# Patient Record
Sex: Female | Born: 1945 | Race: White | Hispanic: No | Marital: Married
Health system: Southern US, Community
[De-identification: ages and names within clinical notes are randomized; demographics above are authoritative.]

## PROBLEM LIST (undated history)

## (undated) DIAGNOSIS — Z972 Presence of dental prosthetic device (complete) (partial): Secondary | ICD-10-CM

## (undated) DIAGNOSIS — Z973 Presence of spectacles and contact lenses: Secondary | ICD-10-CM

## (undated) DIAGNOSIS — K219 Gastro-esophageal reflux disease without esophagitis: Secondary | ICD-10-CM

## (undated) DIAGNOSIS — I10 Essential (primary) hypertension: Secondary | ICD-10-CM

## (undated) DIAGNOSIS — M549 Dorsalgia, unspecified: Secondary | ICD-10-CM

## (undated) DIAGNOSIS — K5090 Crohn's disease, unspecified, without complications: Secondary | ICD-10-CM

## (undated) HISTORY — PX: BACK SURGERY: SHX140

## (undated) HISTORY — PX: UPPER GI ENDOSCOPY: SHX6162

## (undated) HISTORY — PX: TONSILLECTOMY: SUR1361

---

## 2000-05-11 ENCOUNTER — Ambulatory Visit (HOSPITAL_COMMUNITY): Admission: RE | Admit: 2000-05-11 | Discharge: 2000-05-11 | Payer: Self-pay | Admitting: Neurological Surgery

## 2000-05-11 ENCOUNTER — Encounter: Payer: Self-pay | Admitting: Neurological Surgery

## 2000-05-30 ENCOUNTER — Encounter: Payer: Self-pay | Admitting: Neurological Surgery

## 2000-05-30 ENCOUNTER — Ambulatory Visit (HOSPITAL_COMMUNITY): Admission: RE | Admit: 2000-05-30 | Discharge: 2000-05-30 | Payer: Self-pay | Admitting: Neurological Surgery

## 2000-06-22 ENCOUNTER — Encounter: Payer: Self-pay | Admitting: Neurological Surgery

## 2000-06-24 ENCOUNTER — Encounter: Payer: Self-pay | Admitting: Neurological Surgery

## 2000-06-24 ENCOUNTER — Ambulatory Visit (HOSPITAL_COMMUNITY): Admission: RE | Admit: 2000-06-24 | Discharge: 2000-06-25 | Payer: Self-pay | Admitting: Neurological Surgery

## 2004-11-30 HISTORY — PX: TRIGGER FINGER RELEASE: SHX641

## 2005-05-29 ENCOUNTER — Ambulatory Visit (HOSPITAL_COMMUNITY): Admission: RE | Admit: 2005-05-29 | Discharge: 2005-05-29 | Payer: Self-pay | Admitting: Orthopedic Surgery

## 2005-05-29 ENCOUNTER — Ambulatory Visit (HOSPITAL_BASED_OUTPATIENT_CLINIC_OR_DEPARTMENT_OTHER): Admission: RE | Admit: 2005-05-29 | Discharge: 2005-05-29 | Payer: Self-pay | Admitting: Orthopedic Surgery

## 2008-11-30 HISTORY — PX: KNEE ARTHROSCOPY: SHX127

## 2008-12-14 ENCOUNTER — Ambulatory Visit (HOSPITAL_BASED_OUTPATIENT_CLINIC_OR_DEPARTMENT_OTHER): Admission: RE | Admit: 2008-12-14 | Discharge: 2008-12-14 | Payer: Self-pay | Admitting: Orthopedic Surgery

## 2008-12-20 ENCOUNTER — Ambulatory Visit: Payer: Self-pay | Admitting: Vascular Surgery

## 2008-12-20 ENCOUNTER — Encounter (INDEPENDENT_AMBULATORY_CARE_PROVIDER_SITE_OTHER): Payer: Self-pay | Admitting: Orthopedic Surgery

## 2008-12-20 ENCOUNTER — Ambulatory Visit (HOSPITAL_COMMUNITY): Admission: RE | Admit: 2008-12-20 | Discharge: 2008-12-20 | Payer: Self-pay | Admitting: Orthopedic Surgery

## 2010-11-30 HISTORY — PX: LUMBAR LAMINECTOMY: SHX95

## 2010-12-21 ENCOUNTER — Encounter: Payer: Self-pay | Admitting: Orthopedic Surgery

## 2011-03-16 LAB — POCT HEMOGLOBIN-HEMACUE: Hemoglobin: 12.7 g/dL (ref 12.0–15.0)

## 2011-04-10 ENCOUNTER — Other Ambulatory Visit: Payer: Self-pay | Admitting: Neurological Surgery

## 2011-04-10 DIAGNOSIS — M545 Low back pain: Secondary | ICD-10-CM

## 2011-04-14 NOTE — Op Note (Signed)
Leah Mitchell, Leah Mitchell              ACCOUNT NO.:  [id number]   MEDICAL RECORD NO.:  [id number]          PATIENT TYPE:  AMB   LOCATION:  DSC                          FACILITY:  [facility]   PHYSICIAN:  Harvie Junior, M.D.   DATE OF BIRTH:  04/20/1946   DATE OF PROCEDURE:  12/14/2008  DATE OF DISCHARGE:                               OPERATIVE REPORT   PREOPERATIVE DIAGNOSIS:  Medial meniscal tear.   POSTOPERATIVE DIAGNOSES:  Medial meniscal tear.   OPERATIVE PROCEDURES:  Partial posterior horn medial meniscectomy.   SURGEON:  Harvie Junior, MD   ASSISTANT:  Marshia Ly, PA   ANESTHESIA:  General.   BRIEF HISTORY:  [name] is a 65 year old female with a long history  of having had almost a locking sensation in her right knee.  She had  been doing recently well up until that time but ultimately just  developed aggressive locking sensation with severe medial joint line  tenderness.  We evaluated her in the office and felt that she had a  medial meniscal tear, we wanted to make sure.  MRI showed that she did  have a medial meniscal tear at the medial meniscal root.  She is brought  to the operating room for evaluation and debridement as needed.   PROCEDURE:  The patient was brought to the operating room.  After  adequate anesthesia obtained with general anesthetic, the patient was  placed supine on the operating table.  The right leg was prepped and  draped in usual sterile fashion.  Following this, routine arthroscopic  examination of knee revealed there was an obvious posterior horn medial  meniscal tear at the meniscal root.  We debrided this back to smooth and  stable rim, care being taken to try to leave as much meniscus as we  could.  We ultimately did have to take a fair chunk to kind of hollow  out that deep radial tear back at that posterior horn.  The lateral most  flap was debrided as well.  The midbody of meniscus was essentially full  meniscal rim there and sort of  ellipsed back in the posterior area.  Medial femoral condyle had some grade 2 change.  ACL was normal, lateral  side normal.  Patellofemoral joint, no significant arthritic change.  The knee was then copiously and thoroughly irrigated, and then suctioned  dry.  Arthroscopic portals were closed with a bandage.  Sterile  compression dressing was applied.  The patient was taken to the recovery  and found to be in satisfactory condition.  Estimated blood loss for  this procedure was none.      Harvie Junior, M.D.  Electronically Signed     JLG/MEDQ  D:  12/14/2008  T:  12/15/2008  Job:  [id number]

## 2011-04-15 ENCOUNTER — Ambulatory Visit
Admission: RE | Admit: 2011-04-15 | Discharge: 2011-04-15 | Disposition: A | Discharge status: Home / Self Care | Payer: Medicare Other | Source: Ambulatory Visit | Attending: Neurological Surgery | Admitting: Neurological Surgery

## 2011-04-15 DIAGNOSIS — M545 Low back pain: Secondary | ICD-10-CM

## 2011-04-17 NOTE — H&P (Signed)
[place]. [facility]  Patient:    Leah Mitchell, Leah Mitchell                     MRN: [id number] Adm. Date:  [id number] Disc. Date: [id number] Attending:  Jonne Ply                         History and Physical  ADMITTING DIAGNOSIS:  Herniated nucleus pulposus, L5-S1, right, with right S1 radiculopathy.  HISTORY OF PRESENT ILLNESS:  The patient is a 65 year old individual who was treated in the past for a herniated nucleus pulposus at L4-5.  She did well after surgical intervention in August, 1991.  She started to develop pain in the back of the right leg, radiating from the buttock down the posterior aspect of the thigh. She was seen by her chiropractor, Dr. Judy Pimple, in [place], and she underwent some gentle manipulation.  The pain in the right leg then became worse.  She was seen by Dr. Jodi Geralds, orthopedist, and then further work-up was undertaken.  She had some steroid injections into the tensor fasciae latae on the right side.  The symptoms recurred and persisted.  MRI was ultimately performed, demonstrating an herniated nucleus pulposus at the L5-S1 aspect on the right side, compressing the S1 nerve root.  After careful consideration for surgery, she is now being admitted to undergo surgical decompression via a microendoscopic discectomy.  PAST MEDICAL HISTORY:  Significant for Crohns disease; she has otherwise been quite healthy.  She has been on some nonsteroidal anti-inflammatories, but because of her concern of Crohns, she has stopped these medications.  CURRENT MEDICATIONS:  Only E-Vista 60 mg q.d.  ALLERGIES:  She notes an allergy to SULFA, which causes a rash.  PREDNISONE also causes a rash after taking it for a period of about a year.  PAST SURGICAL HISTORY:  Tonsillectomy as a child.  Back operation, 1991.  SOCIAL HISTORY:  She does smoke.  She does not drink alcohol.  Height and weight have been stable at 145 pounds and 5  ft 7 in.  SYSTEMS REVIEW:  Notable for leg pain while walking, wearing ______.  Only findings noted on a 14-point review.  PHYSICAL EXAMINATION:  GENERAL:  She is an alert, oriented, cooperative individual in no overt distress.  VITAL SIGNS:  Blood pressure 144/88, heart rate 66 and regular, respirations 14 and unlabored.  HEENT:  Cranial nerve examination reveals the pupils are 4 mm and briskly reactive to light and accommodation; extraocular movements are full, with asymmetric grimace.  Tongue and uvula are in the midline.  Sclerae and conjunctivae are clear.  NECK:  Reveals no masses and no bruits are heard.  NEUROLOGIC:  Lower extremity strength reveals iliopsoas quadriceps to be ______ and gastrocs have good strength, tone and bulk to confrontation.  Deep tendon reflexes are 2+ in the patellae and 1+ in the Achilles.  Babinski reflexes are downgoing.  Sensation is intact to pin and vibration in distal lower extremities. Straight leg raising is positive on the right side at 45 degrees, and negative n the left to 80 degrees.  Patricks maneuver is negative bilaterally.  Mobility in the back reveals that she can flex forward some 80 degrees and she extends 10 degrees.  Side-bending is possible to 10 degrees without difficulty.  There is modest tenderness to palpation over the rectal ______ superior iliac crest region.  LUNGS:  Clear to auscultation.  HEART:  Regular rate and rhythm.  ABDOMEN:  Soft, bowel sounds are positive.  No masses are palpable.  EXTREMITIES:  Reveal no clubbing, cyanosis or edema.  IMPRESSION:  The patient has a herniated nucleus pulposus at L5-S1 on the right  side.  PLAN:  She is now being admitted to undergo a microdiscectomy. DD:  06/24/00 TD:  06/24/00 Job: [id number]

## 2011-04-17 NOTE — Op Note (Signed)
[place]. [facility]  Patient:    Leah Mitchell, Leah Mitchell                     MRN: [id number] Proc. Date: 06/24/00 Adm. Date:  [id number] Disc. Date: [id number] Attending:  Jonne Ply                           Operative Report  PREOPERATIVE DIAGNOSIS:  L5-S1 herniated nucleus pulposus on the right with right S1 radiculopathy.  POSTOPERATIVE DIAGNOSIS:  L5-S1 herniated nucleus pulposus on the right with right S1 radiculopathy.  PROCEDURE:  Lumbar laminotomy and microdiskectomy with [employer] microendoscopic system and surgical microscope using a microdissection technique.  SURGEON:  Dr. Danielle Dess.  ANESTHESIA:  General endotracheal anesthesia.  INDICATIONS:  The patient is a 65 year old individual who has had significant back and right lower extremity pain.  She has a herniated nucleus pulposus at the L5-S1 level. Having failed conservative management, she is taken to the operating room.  DESCRIPTION OF PROCEDURE:  The patient was brought to the operating room supine on the stretcher.  After smooth induction of general endotracheal anesthesia she was turned prone.  The back was shaved, prepped with Duraprep and draped in a sterile fashion.  The right side of the midline was marked with a marking pen and the image intensifier was brought into view so that the L5-S1 level could be identified.  A K-wire was then passed through the edge of the lamina of L5.  Then using the progressive dilators after making an incision in the skin at the level of the dilator, the dilators were then inserted size over size to enlarge an opening to the 18 mm size using the 5 cm tube to bring it down to the depth of the interlaminar space at L5-S1.  This was then anchored to the table, and the procedure was continued through the [employer]Rx tube clearing the inferior margin lamina of L5 off.  Dissection was then continued out laterally to expose the free edge of the lamina, and the facet  joint of L5-S1.  This was dissected laterally to expose the edge of the common dural tube.  Some scar tissue was encountered at the superior aspect where she had a hemilaminectomy for her disc herniation at L4-5 previously. In this area a small dural tear was noted in the scar tissue.  Attempts to close this primarily were unsuccessful, as the dural tear was on the lateral aspect of the tube, making it difficult to get suture in place.  Nonetheless, after carefully padding and protecting this area, dissection was then carried out laterally and inferiorly to expose the common dural tube, and finally a localizing x-ray identified the position right over the disc space at L5-S1. The common dural tube and the S1 nerve root were retracted medially. Underneath this, a significant amount of disc was in the subligamentous space. The disc space was incised in a cephalad caudad fashion, and several large fragments of disc were removed from the free space.  The disc space was probed, but could not be entered as the inferior margin of L5 abutted the superior margin of the sacrum.  After cleaning out this area carefully it was noted that the S1 nerve root was well dissected free.  The common dural tube was well relieved of any tension on it.  Hemostasis from the epidural veins was obtained with some small pledgets of Gelfoam, which where they abutted  the bone were allowed to remain in place.  Then to close the spinal fluid leak, a mixture of Tisseal was prepared, and this was placed over the common dural tube at the area of the CSF leak.  CSF was noted to be contained by this maneuver.  The area was then inspected.  After several Valsalvas and no leakage being noted the lumbodorsal fascia was closed with 2-0 Vicryl interrupted fashion, 3-0 Vicryl subcuticularly.  The patient tolerated the procedure well, and was returned to the recovery room in stable condition. DD:  06/24/00 TD:  06/25/00 Job:  [id number]

## 2011-04-17 NOTE — Op Note (Signed)
NAMEDIALA, WAXMAN NO.:  [id number]   MEDICAL RECORD NO.:  [id number]          PATIENT TYPE:  OUT   LOCATION:  DFTL                         FACILITY:  [facility]   PHYSICIAN:  Harvie Junior, M.D.   DATE OF BIRTH:  06/29/1946   DATE OF PROCEDURE:  05/29/2005  DATE OF DISCHARGE:  05/29/2005                                 OPERATIVE REPORT   PREOPERATIVE DIAGNOSIS:  Trigger finger, long, right.   POSTOPERATIVE DIAGNOSIS:  Trigger finger, long, right.   OPERATION PERFORMED:  Right trigger finger release.   SURGEON:  Harvie Junior, M.D.   ASSISTANT:  Marshia Ly, P.A.   ANESTHESIA:  General, forearm based IV regional.   INDICATIONS FOR PROCEDURE:  The patient is a 65 year old female with a long  history of having triggering of her right long finger.  We injected her with  excellent relief but because of recurrence and continuing complaints of  pain, the patient was ultimately taken to the operating room for trigger  finger release.   DESCRIPTION OF PROCEDURE:  The patient was brought to the operating room and  after adequate anesthesia was obtained with general anesthetic, patient was  placed on the operating table.  The right hand was prepped and draped in the  usual sterile fashion.  Following this, a small linear incision was made  over the long finger.  Subcutaneous tissue was dissected down to the level  of the A-1 pulley which was clearly identified and divided, care being taken  to identify and protect the digital nerves on both sides of the trigger  finger.  Once this had been released, the tendons were retracted and pulled  into the wound and easy full extension of the flexor tendons was identified  within the wound.  At this point the wound was copiously irrigated and  suctioned dry.  The small skin incision was closed with some interrupted  nylon suture.  Sterile compressive dressing applied.  The patient was then  transferred to the recovery room  where she was noted to be in satisfactory  condition.  The estimated blood loss for this procedure was none.       JLG/MEDQ  D:  06/16/2005  T:  06/17/2005  Job:  [id number]

## 2011-05-04 ENCOUNTER — Encounter (HOSPITAL_COMMUNITY)
Admission: RE | Admit: 2011-05-04 | Discharge: 2011-05-04 | Disposition: A | Discharge status: Home / Self Care | Payer: Medicare Other | Source: Ambulatory Visit | Attending: Neurological Surgery | Admitting: Neurological Surgery

## 2011-05-04 LAB — CBC
HCT: 38.9 % (ref 36.0–46.0)
MCH: 32.8 pg (ref 26.0–34.0)
MCHC: 34.4 g/dL (ref 30.0–36.0)
MCV: 95.3 fL (ref 78.0–100.0)
RDW: 13 % (ref 11.5–15.5)

## 2011-05-05 ENCOUNTER — Inpatient Hospital Stay (HOSPITAL_COMMUNITY): Payer: Medicare Other

## 2011-05-05 ENCOUNTER — Observation Stay (HOSPITAL_COMMUNITY)
Admission: RE | Admit: 2011-05-05 | Discharge: 2011-05-05 | Disposition: A | Discharge status: Home / Self Care | Payer: Medicare Other | Source: Ambulatory Visit | Attending: Neurological Surgery | Admitting: Neurological Surgery

## 2011-05-05 DIAGNOSIS — Z87891 Personal history of nicotine dependence: Secondary | ICD-10-CM | POA: Insufficient documentation

## 2011-05-05 DIAGNOSIS — K5090 Crohn's disease, unspecified, without complications: Secondary | ICD-10-CM | POA: Insufficient documentation

## 2011-05-05 DIAGNOSIS — M5126 Other intervertebral disc displacement, lumbar region: Secondary | ICD-10-CM | POA: Insufficient documentation

## 2011-05-05 DIAGNOSIS — M47817 Spondylosis without myelopathy or radiculopathy, lumbosacral region: Principal | ICD-10-CM | POA: Insufficient documentation

## 2011-05-05 DIAGNOSIS — Z01812 Encounter for preprocedural laboratory examination: Secondary | ICD-10-CM | POA: Insufficient documentation

## 2011-06-04 NOTE — Op Note (Signed)
  [name], [name] NO.:  [id number]  MEDICAL RECORD NO.:  [id number]  LOCATION:  3528                         FACILITY:  [facility]  PHYSICIAN:  Stefani Dama, M.D.  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  PROCEDURE:  Microdiskectomy L5-S1 left with operating microscope, microdissection technique.  SURGEON:  Stefani Dama, MD  FIRST ASSISTANT:  Cristi Loron, MD  ANESTHESIA:  General endotracheal.  INDICATIONS:  Leah Mitchell is a 65 year old individual who has had a previous a right-sided microdiskectomy at L5-S1 on two occasions.  She developed severe left lower extremity pain here in the past month's time.  It has only getting worse despite efforts at bedrest and conservative management and MRI demonstrates presence of a herniated nucleus pulposus L5-S1 on the left side with significant nerve root compromise.  She has been advised regarding need for surgery.  PROCEDURE:  The patient was brought to the operating room, supine on the stretcher.  After smooth induction of general endotracheal anesthesia, she was turned prone.  Back was prepped with alcohol and DuraPrep, draped in sterile fashion.  Midline incision was created through the previous scar and this was dissected down to the lumbodorsal fascia. Fascia was opened on the left side of midline and the paraspinous musculature was stripped from the lower interspace.  L5-S1 was identified positively on radiograph.  Laminotomy of L5 was then undertaken to expose the common dural tube and takeoff of the S1 nerve root.  The disk space was noted to be fairly high riding and  a hemilaminectomy was required in order to see the takeoff of the L5 nerve root, the L5 nerve root and foramen, the disk that was compressing the S1 nerve root in the exit foramen.  With the diskectomy being performed additionally, the common dural tube and the S1 nerve root was well decompressed.  The disk space itself was not entered.  Several fragments of disk were removed and this allowed for good decompression of the S1 nerve root.  Even the crotch of the L5 nerve root superiorly was being impeded by some of this disk material.  Once this was completed and the dissection was completed, no further disk fragments could be identified. The area was carefully inspected.  Hemostasis was achieved with a bipolar cautery and some small pledgets of Gelfoam soaked in thrombin which were later irrigated away and then the lumbodorsal fascia was closed with #1 Vicryl in the lumbodorsal fascia, 2-0 Vicryl in the subcutaneous tissues and 3-0 Vicryl subcuticularly.  The patient tolerated the procedure well, was returned to recovery room in stable condition.     Stefani Dama, M.D.     [name]  D:  05/05/2011  T:  05/06/2011  Job:  [id number]  Electronically Signed by Barnett Abu M.D. on 06/04/2011 05:17:21 PM

## 2012-02-09 DIAGNOSIS — R490 Dysphonia: Secondary | ICD-10-CM | POA: Diagnosis not present

## 2012-02-09 DIAGNOSIS — J060 Acute laryngopharyngitis: Secondary | ICD-10-CM | POA: Diagnosis not present

## 2012-03-01 DIAGNOSIS — R490 Dysphonia: Secondary | ICD-10-CM | POA: Diagnosis not present

## 2012-03-01 DIAGNOSIS — J040 Acute laryngitis: Secondary | ICD-10-CM | POA: Diagnosis not present

## 2012-03-01 DIAGNOSIS — R05 Cough: Secondary | ICD-10-CM | POA: Diagnosis not present

## 2012-03-01 DIAGNOSIS — J209 Acute bronchitis, unspecified: Secondary | ICD-10-CM | POA: Diagnosis not present

## 2012-03-16 DIAGNOSIS — H40039 Anatomical narrow angle, unspecified eye: Secondary | ICD-10-CM | POA: Diagnosis not present

## 2012-03-22 DIAGNOSIS — N899 Noninflammatory disorder of vagina, unspecified: Secondary | ICD-10-CM | POA: Diagnosis not present

## 2012-07-25 DIAGNOSIS — J069 Acute upper respiratory infection, unspecified: Secondary | ICD-10-CM | POA: Diagnosis not present

## 2012-07-25 DIAGNOSIS — R51 Headache: Secondary | ICD-10-CM | POA: Diagnosis not present

## 2012-07-25 DIAGNOSIS — R490 Dysphonia: Secondary | ICD-10-CM | POA: Diagnosis not present

## 2013-03-21 ENCOUNTER — Emergency Department (HOSPITAL_COMMUNITY)
Admission: EM | Admit: 2013-03-21 | Discharge: 2013-03-21 | Disposition: A | Discharge status: Home / Self Care | Payer: Medicare Other | Attending: Emergency Medicine | Admitting: Emergency Medicine

## 2013-03-21 DIAGNOSIS — Z79899 Other long term (current) drug therapy: Secondary | ICD-10-CM | POA: Diagnosis not present

## 2013-03-21 DIAGNOSIS — M25569 Pain in unspecified knee: Secondary | ICD-10-CM | POA: Insufficient documentation

## 2013-03-21 DIAGNOSIS — M25562 Pain in left knee: Secondary | ICD-10-CM

## 2013-03-21 MED ORDER — OXYCODONE-ACETAMINOPHEN 5-325 MG PO TABS: 1.0000 | ORAL_TABLET | Freq: Four times a day (QID) | ORAL | Status: DC | PRN

## 2013-03-21 MED ORDER — DIAZEPAM 5 MG PO TABS: 5.0000 mg | ORAL_TABLET | Freq: Two times a day (BID) | ORAL | Status: DC | PRN

## 2013-03-21 NOTE — ED Notes (Signed)
Pt here for MRI of left knee. Has had continued left knee pain x 4 days. Seeing PCP, but due to not being able to extend knee hasn't been able to have a complete MRI. MD Lynelle Doctor at bedside. PT made aware that our MRI is not open at night, nor does the ED perform conscious sedation during MRI. Family at bedside made aware of plan at this time.

## 2013-03-21 NOTE — ED Notes (Signed)
Pt presents to ED with Left knee pain x4-5 days, pain progressively worse, pt reports when she stood up today to reach for her walker she experienced severe "tearing" pain. Pt was sent here to have an MRI with conscious sedation d/t pt is unable to straighten her leg

## 2013-03-21 NOTE — ED Provider Notes (Signed)
History    CSN: [id number] Arrival date & time 03/21/13  1911 First MD Initiated Contact with Patient 03/21/13 2206      Chief Complaint  Patient presents with  . Leg Pain     HPI The patient presents to the emergency room because of persistent left knee pain. Patient states she came to the emergency room because she was unable to straighten her leg in order to have an outpatient MRI today. Patient states the MRI center told her she should come to the emergency room in order to get sedated to have the MRI done at the hospital. The patient has been seeing Dr. Luiz Blare for her knee pain. She was evaluated in the office and an outpatient MRI was ordered. It all started a few in the last week when she felt a pop type sensation in her knee. She has not been able to extend her knee properly since that time. Patient denies any numbness or weakness. She denies any swelling.  No past medical history on file.  No past surgical history on file.  No family history on file.  History  Substance Use Topics  . Smoking status: Not on file  . Smokeless tobacco: Not on file  . Alcohol Use: Not on file    OB History   No data available      Review of Systems  All other systems reviewed and are negative.    Allergies  Aspirin; Prednisone; and Sulfa antibiotics  Home Medications   Current Outpatient Rx  Name  Route  Sig  Dispense  Refill  . acetaminophen (TYLENOL) 500 MG tablet   Oral   Take 500 mg by mouth every 6 (six) hours as needed for pain.         . Ascorbic Acid (VITAMIN C PO)   Oral   Take 1 tablet by mouth daily.         . B Complex Vitamins (VITAMIN B-COMPLEX PO)   Oral   Take 1 tablet by mouth daily.         Marland Kitchen CALCIUM PO   Oral   Take 1 tablet by mouth 2 (two) times daily.         . Cholecalciferol (VITAMIN D-3 PO)   Oral   Take 1 tablet by mouth daily.         Marland Kitchen CINNAMON PO   Oral   Take 1 tablet by mouth 2 (two) times daily.         . fish  oil-omega-3 fatty acids 1000 MG capsule   Oral   Take 1 g by mouth 2 (two) times daily.         . Multiple Vitamin (MULTIVITAMIN WITH MINERALS) TABS   Oral   Take 1 tablet by mouth daily.         . Probiotic Product (PROBIOTIC DAILY PO)   Oral   Take 1 tablet by mouth daily.         . Red Yeast Rice Extract (RED YEAST RICE PO)   Oral   Take 1 capsule by mouth 2 (two) times daily.         . traMADol (ULTRAM) 50 MG tablet   Oral   Take 50-100 mg by mouth every 6 (six) hours as needed for pain.         . vitamin E 400 UNIT capsule   Oral   Take 400 Units by mouth daily.         Marland Kitchen  diazepam (VALIUM) 5 MG tablet   Oral   Take 1 tablet (5 mg total) by mouth 2 (two) times daily as needed (muscle spasm).   2 tablet   0   . oxyCODONE-acetaminophen (PERCOCET/ROXICET) 5-325 MG per tablet   Oral   Take 1-2 tablets by mouth every 6 (six) hours as needed for pain.   8 tablet   0     BP 184/61  Pulse 71  Temp(Src) 98.1 F (36.7 C) (Oral)  Resp 16  SpO2 100%  Physical Exam  Nursing note and vitals reviewed. Constitutional: She appears well-developed and well-nourished. No distress.  HENT:  Head: Normocephalic and atraumatic.  Right Ear: External ear normal.  Left Ear: External ear normal.  Eyes: Conjunctivae are normal. Right eye exhibits no discharge. Left eye exhibits no discharge. No scleral icterus.  Neck: Neck supple. No tracheal deviation present.  Cardiovascular: Normal rate.   Pulmonary/Chest: Effort normal. No stridor. No respiratory distress.  Musculoskeletal: She exhibits no edema.       Left knee: She exhibits decreased range of motion. She exhibits no deformity, no laceration and no erythema. Tenderness found.  Strong dorsalis pedis pulse, neurovascular intact no significant effusion noted, no erythema the knee, pain with extension of the knee,  Neurological: She is alert. Cranial nerve deficit: no gross deficits.  Skin: Skin is warm and dry. No  rash noted.  Psychiatric: She has a normal mood and affect.    ED Course  Procedures (including critical care time)  Labs Reviewed - No data to display No results found.   1. Knee pain, left       MDM  I explained to the family but unfortunately MRI is not available at this time of evening.  Typically MRIs of the knee are not performed in the emergency department. It is possible to have sedation range but that is usually a scheduled procedure.  I spoke with her physician Dr. Luiz Blare. He will see her in the office tomorrow. They will plan on doing anesthetic injection of her knees she can have the MRI performed. Also prescribed the patient Percocet and Valium that she can try taking before she has her knee MRI performed.         Celene Kras, MD 03/21/13 2245

## 2013-03-22 DIAGNOSIS — IMO0002 Reserved for concepts with insufficient information to code with codable children: Secondary | ICD-10-CM | POA: Diagnosis not present

## 2013-03-22 DIAGNOSIS — M25569 Pain in unspecified knee: Secondary | ICD-10-CM | POA: Diagnosis not present

## 2013-03-23 ENCOUNTER — Encounter (HOSPITAL_BASED_OUTPATIENT_CLINIC_OR_DEPARTMENT_OTHER): Payer: Self-pay | Admitting: *Deleted

## 2013-03-23 ENCOUNTER — Other Ambulatory Visit: Payer: Self-pay | Admitting: Orthopedic Surgery

## 2013-03-23 NOTE — Progress Notes (Signed)
No labs needed. Was here 2010 for rt knee

## 2013-03-24 ENCOUNTER — Ambulatory Visit (HOSPITAL_BASED_OUTPATIENT_CLINIC_OR_DEPARTMENT_OTHER): Payer: Medicare Other | Admitting: Anesthesiology

## 2013-03-24 ENCOUNTER — Encounter (HOSPITAL_BASED_OUTPATIENT_CLINIC_OR_DEPARTMENT_OTHER)
Admission: RE | Disposition: A | Discharge status: Home / Self Care | Payer: Self-pay | Source: Ambulatory Visit | Attending: Orthopedic Surgery

## 2013-03-24 ENCOUNTER — Encounter (HOSPITAL_BASED_OUTPATIENT_CLINIC_OR_DEPARTMENT_OTHER): Payer: Self-pay | Admitting: Anesthesiology

## 2013-03-24 ENCOUNTER — Ambulatory Visit (HOSPITAL_BASED_OUTPATIENT_CLINIC_OR_DEPARTMENT_OTHER)
Admission: RE | Admit: 2013-03-24 | Discharge: 2013-03-24 | Disposition: A | Discharge status: Home / Self Care | Payer: Medicare Other | Source: Ambulatory Visit | Attending: Orthopedic Surgery | Admitting: Orthopedic Surgery

## 2013-03-24 ENCOUNTER — Encounter (HOSPITAL_BASED_OUTPATIENT_CLINIC_OR_DEPARTMENT_OTHER): Payer: Self-pay | Admitting: *Deleted

## 2013-03-24 DIAGNOSIS — Z87891 Personal history of nicotine dependence: Secondary | ICD-10-CM | POA: Diagnosis not present

## 2013-03-24 DIAGNOSIS — IMO0002 Reserved for concepts with insufficient information to code with codable children: Secondary | ICD-10-CM | POA: Insufficient documentation

## 2013-03-24 DIAGNOSIS — M25569 Pain in unspecified knee: Secondary | ICD-10-CM | POA: Diagnosis not present

## 2013-03-24 DIAGNOSIS — X58XXXA Exposure to other specified factors, initial encounter: Secondary | ICD-10-CM | POA: Insufficient documentation

## 2013-03-24 DIAGNOSIS — M6750 Plica syndrome, unspecified knee: Secondary | ICD-10-CM | POA: Diagnosis not present

## 2013-03-24 DIAGNOSIS — K219 Gastro-esophageal reflux disease without esophagitis: Secondary | ICD-10-CM | POA: Insufficient documentation

## 2013-03-24 DIAGNOSIS — K5090 Crohn's disease, unspecified, without complications: Secondary | ICD-10-CM | POA: Insufficient documentation

## 2013-03-24 DIAGNOSIS — Z79899 Other long term (current) drug therapy: Secondary | ICD-10-CM | POA: Insufficient documentation

## 2013-03-24 DIAGNOSIS — M2390 Unspecified internal derangement of unspecified knee: Secondary | ICD-10-CM | POA: Diagnosis not present

## 2013-03-24 HISTORY — DX: Dorsalgia, unspecified: M54.9

## 2013-03-24 HISTORY — PX: KNEE ARTHROSCOPY: SHX127

## 2013-03-24 HISTORY — DX: Crohn's disease, unspecified, without complications: K50.90

## 2013-03-24 HISTORY — DX: Presence of spectacles and contact lenses: Z97.3

## 2013-03-24 HISTORY — DX: Presence of dental prosthetic device (complete) (partial): Z97.2

## 2013-03-24 HISTORY — DX: Gastro-esophageal reflux disease without esophagitis: K21.9

## 2013-03-24 SURGERY — ARTHROSCOPY, KNEE
Anesthesia: General | Site: Knee | Laterality: Left | Wound class: Clean

## 2013-03-24 MED ORDER — ONDANSETRON HCL 4 MG/2ML IJ SOLN: 4.0000 mg | Freq: Once | INTRAMUSCULAR | Status: AC | PRN

## 2013-03-24 MED ORDER — TRAMADOL HCL 50 MG PO TABS: 50.0000 mg | ORAL_TABLET | Freq: Four times a day (QID) | ORAL | Status: DC | PRN

## 2013-03-24 MED ORDER — DEXAMETHASONE SODIUM PHOSPHATE 4 MG/ML IJ SOLN
INTRAMUSCULAR | Status: DC | PRN
  Administered 2013-03-24: 4 mg via INTRAVENOUS

## 2013-03-24 MED ORDER — ONDANSETRON HCL 4 MG/2ML IJ SOLN
INTRAMUSCULAR | Status: DC | PRN
  Administered 2013-03-24: 4 mg via INTRAVENOUS

## 2013-03-24 MED ORDER — FENTANYL CITRATE 0.05 MG/ML IJ SOLN: 50.0000 ug | INTRAMUSCULAR | Status: DC | PRN

## 2013-03-24 MED ORDER — MIDAZOLAM HCL 2 MG/2ML IJ SOLN: 1.0000 mg | INTRAMUSCULAR | Status: DC | PRN

## 2013-03-24 MED ORDER — POVIDONE-IODINE 7.5 % EX SOLN: Freq: Once | CUTANEOUS | Status: DC

## 2013-03-24 MED ORDER — OXYCODONE HCL 5 MG/5ML PO SOLN: 5.0000 mg | Freq: Once | ORAL | Status: AC | PRN

## 2013-03-24 MED ORDER — LIDOCAINE HCL (CARDIAC) 20 MG/ML IV SOLN
INTRAVENOUS | Status: DC | PRN
  Administered 2013-03-24: 100 mg via INTRAVENOUS

## 2013-03-24 MED ORDER — LACTATED RINGERS IV SOLN
INTRAVENOUS | Status: DC
  Administered 2013-03-24 (×3): via INTRAVENOUS

## 2013-03-24 MED ORDER — SODIUM CHLORIDE 0.9 % IR SOLN
Status: DC | PRN
  Administered 2013-03-24: 6000 mL

## 2013-03-24 MED ORDER — MIDAZOLAM HCL 5 MG/5ML IJ SOLN
INTRAMUSCULAR | Status: DC | PRN
  Administered 2013-03-24: 1 mg via INTRAVENOUS

## 2013-03-24 MED ORDER — OXYCODONE HCL 5 MG PO TABS: 5.0000 mg | ORAL_TABLET | Freq: Once | ORAL | Status: AC | PRN

## 2013-03-24 MED ORDER — PROPOFOL 10 MG/ML IV BOLUS
INTRAVENOUS | Status: DC | PRN
  Administered 2013-03-24: 200 mg via INTRAVENOUS

## 2013-03-24 MED ORDER — HYDROMORPHONE HCL PF 1 MG/ML IJ SOLN
0.2500 mg | INTRAMUSCULAR | Status: DC | PRN
  Administered 2013-03-24: 0.5 mg via INTRAVENOUS

## 2013-03-24 MED ORDER — CEFAZOLIN SODIUM-DEXTROSE 2-3 GM-% IV SOLR
2.0000 g | INTRAVENOUS | Status: AC
  Administered 2013-03-24: 2 g via INTRAVENOUS

## 2013-03-24 MED ORDER — SUFENTANIL CITRATE 50 MCG/ML IV SOLN
INTRAVENOUS | Status: DC | PRN
  Administered 2013-03-24 (×2): 5 ug via INTRAVENOUS

## 2013-03-24 MED ORDER — EPINEPHRINE HCL 1 MG/ML IJ SOLN
INTRAMUSCULAR | Status: DC | PRN
  Administered 2013-03-24: 2 mg

## 2013-03-24 SURGICAL SUPPLY — 42 items
BANDAGE ELASTIC 6 VELCRO ST LF (GAUZE/BANDAGES/DRESSINGS) ×2 IMPLANT
BLADE 4.2CUDA (BLADE) IMPLANT
BLADE GREAT WHITE 4.2 (BLADE) ×3 IMPLANT
CANISTER OMNI JUG 16 LITER (MISCELLANEOUS) ×1 IMPLANT
CANISTER SUCTION 2500CC (MISCELLANEOUS) IMPLANT
CLOTH BEACON ORANGE TIMEOUT ST (SAFETY) ×2 IMPLANT
CUTTER MENISCUS  4.2MM (BLADE)
CUTTER MENISCUS 4.2MM (BLADE) IMPLANT
DRAPE ARTHROSCOPY W/POUCH 114 (DRAPES) ×2 IMPLANT
DRSG EMULSION OIL 3X3 NADH (GAUZE/BANDAGES/DRESSINGS) ×2 IMPLANT
DURAPREP 26ML APPLICATOR (WOUND CARE) ×2 IMPLANT
ELECT MENISCUS 165MM 90D (ELECTRODE) IMPLANT
ELECT REM PT RETURN 9FT ADLT (ELECTROSURGICAL)
ELECTRODE REM PT RTRN 9FT ADLT (ELECTROSURGICAL) IMPLANT
GLOVE BIO SURGEON STRL SZ 6.5 (GLOVE) ×2 IMPLANT
GLOVE BIOGEL PI IND STRL 7.0 (GLOVE) ×2 IMPLANT
GLOVE BIOGEL PI IND STRL 8 (GLOVE) ×2 IMPLANT
GLOVE BIOGEL PI INDICATOR 7.0 (GLOVE) ×2
GLOVE BIOGEL PI INDICATOR 8 (GLOVE) ×3
GLOVE ECLIPSE 7.5 STRL STRAW (GLOVE) ×4 IMPLANT
GLOVE SS BIOGEL STRL SZ 8 (GLOVE) IMPLANT
GLOVE SUPERSENSE BIOGEL SZ 8 (GLOVE) ×1
GOWN BRE IMP PREV XXLGXLNG (GOWN DISPOSABLE) ×3 IMPLANT
GOWN PREVENTION PLUS XLARGE (GOWN DISPOSABLE) ×2 IMPLANT
GOWN PREVENTION PLUS XXLARGE (GOWN DISPOSABLE) ×4 IMPLANT
HOLDER KNEE FOAM BLUE (MISCELLANEOUS) ×2 IMPLANT
KNEE WRAP E Z 3 GEL PACK (MISCELLANEOUS) ×2 IMPLANT
NDL SAFETY ECLIPSE 18X1.5 (NEEDLE) IMPLANT
NEEDLE HYPO 18GX1.5 SHARP (NEEDLE) ×2
PACK ARTHROSCOPY DSU (CUSTOM PROCEDURE TRAY) ×2 IMPLANT
PACK BASIN DAY SURGERY FS (CUSTOM PROCEDURE TRAY) ×2 IMPLANT
PAD CAST 4YDX4 CTTN HI CHSV (CAST SUPPLIES) ×1 IMPLANT
PADDING CAST COTTON 4X4 STRL (CAST SUPPLIES) ×2
PENCIL BUTTON HOLSTER BLD 10FT (ELECTRODE) IMPLANT
SET ARTHROSCOPY TUBING (MISCELLANEOUS) ×2
SET ARTHROSCOPY TUBING LN (MISCELLANEOUS) ×1 IMPLANT
SPONGE GAUZE 4X4 12PLY (GAUZE/BANDAGES/DRESSINGS) ×3 IMPLANT
SUT ETHILON 4 0 PS 2 18 (SUTURE) IMPLANT
SYR 5ML LL (SYRINGE) ×2 IMPLANT
TOWEL OR 17X24 6PK STRL BLUE (TOWEL DISPOSABLE) ×2 IMPLANT
TOWEL OR NON WOVEN STRL DISP B (DISPOSABLE) ×2 IMPLANT
WATER STERILE IRR 1000ML POUR (IV SOLUTION) ×2 IMPLANT

## 2013-03-24 NOTE — H&P (Signed)
PREOPERATIVE H&P  Chief Complaint: l knee pain and inability to straighten leg  HPI: Leah Mitchell is a 67 y.o. female who presents for evaluation of l knee pain. It has been present for 10 days and has been worsening. She has failed conservative measures. Pain is rated as severe.pt attempted MRI but was unable to straighten leg to have study.  After long discussion we felt proceeding with knee scope was best approach.  Past Medical History  Diagnosis Date  . Crohn disease   . GERD (gastroesophageal reflux disease)     no meds  . Back pain   . Wears glasses   . Wears partial dentures     upper partial   Past Surgical History  Procedure Laterality Date  . Tonsillectomy    . Upper gi endoscopy    . Lumbar laminectomy  2012    micerodiskectomy  . Back surgery  91,2001    lumb lam  . Knee arthroscopy  2010    right   . Trigger finger release  2006    rt long   History   Social History  . Marital Status: Married    Spouse Name: N/A    Number of Children: N/A  . Years of Education: N/A   Social History Main Topics  . Smoking status: Former Smoker    Quit date: 03/23/1973  . Smokeless tobacco: None  . Alcohol Use: No  . Drug Use: No  . Sexually Active: None   Other Topics Concern  . None   Social History Narrative  . None   History reviewed. No pertinent family history. Allergies  Allergen Reactions  . Aspirin Other (See Comments)    Causes constipation   . Prednisone Rash  . Sulfa Antibiotics Rash   Prior to Admission medications   Medication Sig Start Date End Date Taking? Authorizing Provider  acetaminophen (TYLENOL) 500 MG tablet Take 500 mg by mouth every 6 (six) hours as needed for pain.   Yes Historical Provider, MD  Ascorbic Acid (VITAMIN C PO) Take 1 tablet by mouth daily.   Yes Historical Provider, MD  B Complex Vitamins (VITAMIN B-COMPLEX PO) Take 1 tablet by mouth daily.   Yes Historical Provider, MD  CALCIUM PO Take 1 tablet by mouth 2  (two) times daily.   Yes Historical Provider, MD  Cholecalciferol (VITAMIN D-3 PO) Take 1 tablet by mouth daily.   Yes Historical Provider, MD  CINNAMON PO Take 1 tablet by mouth 2 (two) times daily.   Yes Historical Provider, MD  fish oil-omega-3 fatty acids 1000 MG capsule Take 1 g by mouth 2 (two) times daily.   Yes Historical Provider, MD  Multiple Vitamin (MULTIVITAMIN WITH MINERALS) TABS Take 1 tablet by mouth daily.   Yes Historical Provider, MD  oxyCODONE-acetaminophen (PERCOCET/ROXICET) 5-325 MG per tablet Take 1-2 tablets by mouth every 6 (six) hours as needed for pain. 03/21/13  Yes Celene Kras, MD  Probiotic Product (PROBIOTIC DAILY PO) Take 1 tablet by mouth daily.   Yes Historical Provider, MD  Red Yeast Rice Extract (RED YEAST RICE PO) Take 1 capsule by mouth 2 (two) times daily.   Yes Historical Provider, MD  traMADol (ULTRAM) 50 MG tablet Take 50-100 mg by mouth every 6 (six) hours as needed for pain.   Yes Historical Provider, MD  vitamin E 400 UNIT capsule Take 400 Units by mouth daily.   Yes Historical Provider, MD  diazepam (VALIUM) 5 MG tablet Take 1 tablet (  5 mg total) by mouth 2 (two) times daily as needed (muscle spasm). 03/21/13   Celene Kras, MD     Positive ROS: none  All other systems have been reviewed and were otherwise negative with the exception of those mentioned in the HPI and as above.  Physical Exam: Filed Vitals:   03/24/13 1021  BP: 152/68  Pulse: 78  Temp: 98.1 F (36.7 C)  Resp: 20    General: Alert, no acute distress Cardiovascular: No pedal edema Respiratory: No cyanosis, no use of accessory musculature GI: No organomegaly, abdomen is soft and non-tender Skin: No lesions in the area of chief complaint Neurologic: Sensation intact distally Psychiatric: Patient is competent for consent with normal mood and affect Lymphatic: No axillary or cervical lymphadenopathy  MUSCULOSKELETAL: L knee ROM-20-85deg//med jt line  tender//-instability  Assessment/Plan: LEFT LOCKED KNEE with PAIN and inability to straighten Plan for Procedure(s): ARTHROSCOPY KNEE  The risks benefits and alternatives were discussed with the patient including but not limited to the risks of nonoperative treatment, versus surgical intervention including infection, bleeding, nerve injury, malunion, nonunion, hardware prominence, hardware failure, need for hardware removal, blood clots, cardiopulmonary complications, morbidity, mortality, among others, and they were willing to proceed.  Predicted outcome is good, although there will be at least a six to nine month expected recovery.  Holy Battenfield L, MD 03/24/2013 10:44 AM

## 2013-03-24 NOTE — Anesthesia Procedure Notes (Signed)
Procedure Name: LMA Insertion Date/Time: 03/24/2013 11:36 AM Performed by: Zenia Resides D Pre-anesthesia Checklist: Patient identified, Emergency Drugs available, Suction available and Patient being monitored Patient Re-evaluated:Patient Re-evaluated prior to inductionOxygen Delivery Method: Circle System Utilized Preoxygenation: Pre-oxygenation with 100% oxygen Intubation Type: IV induction Ventilation: Mask ventilation without difficulty LMA: LMA inserted LMA Size: 4.0 Number of attempts: 1 Airway Equipment and Method: bite block Placement Confirmation: positive ETCO2 and breath sounds checked- equal and bilateral Tube secured with: Tape Dental Injury: Teeth and Oropharynx as per pre-operative assessment

## 2013-03-24 NOTE — Anesthesia Postprocedure Evaluation (Signed)
  Anesthesia Post-op Note  [name]  Procedure(s) Performed: Procedure(s): ARTHROSCOPY KNEE MEDIAL PLICA EXCISION, AND  PARTIAL MEDIAL MENISECTOMY (Left)  Patient Location: PACU  Anesthesia Type:General  Level of Consciousness: awake and alert   Airway and Oxygen Therapy: Patient Spontanous Breathing  Post-op Pain: mild  Post-op Assessment: Post-op Vital signs reviewed  Post-op Vital Signs: Reviewed  Complications: No apparent anesthesia complications

## 2013-03-24 NOTE — Transfer of Care (Signed)
Immediate Anesthesia Transfer of Care Note  [name]  Procedure(s) Performed: Procedure(s): ARTHROSCOPY KNEE MEDIAL PLICA EXCISION, AND  PARTIAL MEDIAL MENISECTOMY (Left)  Patient Location: PACU  Anesthesia Type:General  Level of Consciousness: awake  Airway & Oxygen Therapy: Patient Spontanous Breathing and Patient connected to face mask oxygen  Post-op Assessment: Report given to PACU RN and Post -op Vital signs reviewed and stable  Post vital signs: Reviewed and stable  Complications: No apparent anesthesia complications

## 2013-03-24 NOTE — Brief Op Note (Signed)
03/24/2013  12:14 PM  PATIENT:  Nakeya S Sherman  67 y.o. female  PRE-OPERATIVE DIAGNOSIS:  LEFT LOCKED KNEE PAIN  POST-OPERATIVE DIAGNOSIS:  LEFT LOCKED KNEE PAIN  PROCEDURE:  Procedure(s): ARTHROSCOPY KNEE (Left)  SURGEON:  Surgeon(s) and Role:    * Harvie Junior, MD - Primary  PHYSICIAN ASSISTANT:   ASSISTANTS: bethune   ANESTHESIA:   general  EBL:  Total I/O In: 1000 [I.V.:1000] Out: -   BLOOD ADMINISTERED:none  DRAINS: none   LOCAL MEDICATIONS USED:  MARCAINE     SPECIMEN:  No Specimen  DISPOSITION OF SPECIMEN:  N/A  COUNTS:  YES  TOURNIQUET:  * No tourniquets in log *  DICTATION: .Other Dictation: Dictation Number [id number]  PLAN OF CARE: Discharge to home after PACU  PATIENT DISPOSITION:  PACU - hemodynamically stable.   Delay start of Pharmacological VTE agent (>24hrs) due to surgical blood loss or risk of bleeding: no

## 2013-03-24 NOTE — Anesthesia Preprocedure Evaluation (Addendum)
Anesthesia Evaluation  Patient identified by MRN, date of birth, ID band Patient awake    Reviewed: Allergy & Precautions, H&P , NPO status , Patient's Chart, lab work & pertinent test results  Airway Mallampati: I TM Distance: >3 FB Neck ROM: Full    Dental  (+) Teeth Intact and Dental Advisory Given   Pulmonary  breath sounds clear to auscultation        Cardiovascular Rhythm:Regular Rate:Normal     Neuro/Psych    GI/Hepatic GERD-  Medicated and Controlled,  Endo/Other    Renal/GU      Musculoskeletal   Abdominal   Peds  Hematology   Anesthesia Other Findings   Reproductive/Obstetrics                           Anesthesia Physical Anesthesia Plan  ASA: II  Anesthesia Plan: General   Post-op Pain Management:    Induction: Intravenous  Airway Management Planned: LMA  Additional Equipment:   Intra-op Plan:   Post-operative Plan: Extubation in OR  Informed Consent: I have reviewed the patients History and Physical, chart, labs and discussed the procedure including the risks, benefits and alternatives for the proposed anesthesia with the patient or authorized representative who has indicated his/her understanding and acceptance.   Dental advisory given  Plan Discussed with: CRNA, Anesthesiologist and Surgeon  Anesthesia Plan Comments:         Anesthesia Quick Evaluation

## 2013-03-25 NOTE — Op Note (Deleted)
NAME:  Leah Mitchell, Leah Mitchell            ACCOUNT NO.:  [id number]  MEDICAL RECORD NO.:  [id number]  LOCATION:  A04C                         FACILITY:  [facility]  PHYSICIAN:  Clothilde Tippetts L. Adrianna Dudas, M.D.   DATE OF BIRTH:  03/06/1946  DATE OF PROCEDURE:  03/24/2013 DATE OF DISCHARGE:  03/24/2013                              OPERATIVE REPORT   PREOPERATIVE DIAGNOSIS:  Medial meniscal tear.  POSTOPERATIVE DIAGNOSIS: 1. Medial meniscal tear. 2. Medial shelf plica.  PRINCIPAL PROCEDURE: 1. Partial posterior horn medial meniscectomy. 2. Debridement of medial shelf plica.  SURGEON:  Lundon Verdejo L. Breydan Shillingburg, M.D.  ASSISTANT:  James Bethune, P.A.  ANESTHESIA:  General.  BRIEF HISTORY:  Leah Mitchell is a 67-year-old female with a history of having significant complaints of left knee pain.  We treated conservatively for about a week or so.  She came in with an ability to straighten the leg, sudden onset severe pain.  We tried to get an MRI. She is incapable of lying still for the MRI and incapable of getting the leg straight for an MRI, we tried to medicate her and could get an MRI. She was miserable, unable stand or walk.  We had a long talk about treatment options and felt that arthroscopic surgery is appropriate given that she had a locked knee.  She was brought to the operating room for this procedure.  DESCRIPTION OF PROCEDURE:  The patient was brought to the operating room.  After adequate anesthesia was obtained with general anesthetic, the patient was placed supine on the operating table.  The left leg was then prepped and draped in the usual sterile fashion.  Following this, routine arthroscopic examination of the knee revealed that there was a large medial shelf plica, which was irritating the medial femoral condyle.  This was debrided back to the rim of the capsule attention turned to the medial compartment, where the medial meniscus was probed and posteriorly in the point you could see where there  was injury to the medial meniscus and I did pull out into the joint.  This was right back at the meniscal root.  We debrided this back at the root and moved contoured the meniscal rim towards the remaining portion of the meniscus.  Once this was done to the ACL, normal lateral side, normal back end of the medial compartment.  I probed the medial meniscus at length and now felt it to be stable.  At this point, the knee was copiously and thoroughly lavaged and suctioned dry.  The arthroscopic portals were closed with bandage.  Sterile compressive dressing was applied, and the patient was taken to the recovery room and was noted to be in satisfactory condition.  Estimated blood loss for the procedure was none.     Tayshaun Kroh L. Kaydon Creedon, M.D.     JLG/MEDQ  D:  03/24/2013  T:  03/25/2013  Job:  [id number] 

## 2013-03-25 NOTE — Op Note (Deleted)
NAME:  EMAREE, CHIU            ACCOUNT NO.:  [id number]  MEDICAL RECORD NO.:  [id number]  LOCATION:  A04C                         FACILITY:  [facility]  PHYSICIAN:  Harvie Junior, M.D.   DATE OF BIRTH:  [birth year]  DATE OF PROCEDURE:  03/24/2013 DATE OF DISCHARGE:  03/24/2013                              OPERATIVE REPORT   PREOPERATIVE DIAGNOSIS:  Medial meniscal tear.  POSTOPERATIVE DIAGNOSIS: 1. Medial meniscal tear. 2. Medial shelf plica.  PRINCIPAL PROCEDURE: 1. Partial posterior horn medial meniscectomy. 2. Debridement of medial shelf plica.  SURGEON:  Harvie Junior, M.D.  ASSISTANT:  Marshia Ly, P.A.  ANESTHESIA:  General.  BRIEF HISTORY:  Ms. Ciccarelli is a 67 year old female with a history of having significant complaints of left knee pain.  We treated conservatively for about a week or so.  She came in with an ability to straighten the leg, sudden onset severe pain.  We tried to get an MRI. She is incapable of lying still for the MRI and incapable of getting the leg straight for an MRI, we tried to medicate her and could get an MRI. She was miserable, unable stand or walk.  We had a long talk about treatment options and felt that arthroscopic surgery is appropriate given that she had a locked knee.  She was brought to the operating room for this procedure.  DESCRIPTION OF PROCEDURE:  The patient was brought to the operating room.  After adequate anesthesia was obtained with general anesthetic, the patient was placed supine on the operating table.  The left leg was then prepped and draped in the usual sterile fashion.  Following this, routine arthroscopic examination of the knee revealed that there was a large medial shelf plica, which was irritating the medial femoral condyle.  This was debrided back to the rim of the capsule attention turned to the medial compartment, where the medial meniscus was probed and posteriorly in the point you could see where there  was injury to the medial meniscus and I did pull out into the joint.  This was right back at the meniscal root.  We debrided this back at the root and moved contoured the meniscal rim towards the remaining portion of the meniscus.  Once this was done to the ACL, normal lateral side, normal back end of the medial compartment.  I probed the medial meniscus at length and now felt it to be stable.  At this point, the knee was copiously and thoroughly lavaged and suctioned dry.  The arthroscopic portals were closed with bandage.  Sterile compressive dressing was applied, and the patient was taken to the recovery room and was noted to be in satisfactory condition.  Estimated blood loss for the procedure was none.     Harvie Junior, M.D.     Ranae Plumber  D:  03/24/2013  T:  03/25/2013  Job:  [id number]

## 2013-03-25 NOTE — Op Note (Signed)
NAME:  Curtice, Ilma S            ACCOUNT NO.:  [id number]  MEDICAL RECORD NO.:  [id number]  LOCATION:  A04C                         FACILITY:  [facility]  PHYSICIAN:  Juma Oxley L. Majesti Gambrell, M.D.   DATE OF BIRTH:  05/26/1946  DATE OF PROCEDURE:  03/24/2013 DATE OF DISCHARGE:  03/24/2013                              OPERATIVE REPORT   PREOPERATIVE DIAGNOSIS:  Medial meniscal tear.  POSTOPERATIVE DIAGNOSIS: 1. Medial meniscal tear. 2. Medial shelf plica.  PRINCIPAL PROCEDURE: 1. Partial posterior horn medial meniscectomy. 2. Debridement of medial shelf plica.  SURGEON:  Andretta Ergle L. Johnthan Axtman, M.D.  ASSISTANT:  James Bethune, P.A.  ANESTHESIA:  General.  BRIEF HISTORY:  Ms. Kapusta is a 67-year-old female with a history of having significant complaints of left knee pain.  We treated conservatively for about a week or so.  She came in with an ability to straighten the leg, sudden onset severe pain.  We tried to get an MRI. She is incapable of lying still for the MRI and incapable of getting the leg straight for an MRI, we tried to medicate her and could get an MRI. She was miserable, unable stand or walk.  We had a long talk about treatment options and felt that arthroscopic surgery is appropriate given that she had a locked knee.  She was brought to the operating room for this procedure.  DESCRIPTION OF PROCEDURE:  The patient was brought to the operating room.  After adequate anesthesia was obtained with general anesthetic, the patient was placed supine on the operating table.  The left leg was then prepped and draped in the usual sterile fashion.  Following this, routine arthroscopic examination of the knee revealed that there was a large medial shelf plica, which was irritating the medial femoral condyle.  This was debrided back to the rim of the capsule attention turned to the medial compartment, where the medial meniscus was probed and posteriorly in the point you could see where there  was injury to the medial meniscus and I did pull out into the joint.  This was right back at the meniscal root.  We debrided this back at the root and moved contoured the meniscal rim towards the remaining portion of the meniscus.  Once this was done to the ACL, normal lateral side, normal back end of the medial compartment.  I probed the medial meniscus at length and now felt it to be stable.  At this point, the knee was copiously and thoroughly lavaged and suctioned dry.  The arthroscopic portals were closed with bandage.  Sterile compressive dressing was applied, and the patient was taken to the recovery room and was noted to be in satisfactory condition.  Estimated blood loss for the procedure was none.     Gardenia Witter L. Tashana Haberl, M.D.     JLG/MEDQ  D:  03/24/2013  T:  03/25/2013  Job:  [id number] 

## 2013-03-27 ENCOUNTER — Encounter (HOSPITAL_BASED_OUTPATIENT_CLINIC_OR_DEPARTMENT_OTHER): Payer: Self-pay | Admitting: Orthopedic Surgery

## 2013-03-27 LAB — POCT HEMOGLOBIN-HEMACUE: Hemoglobin: 12.2 g/dL (ref 12.0–15.0)

## 2013-04-05 DIAGNOSIS — M25569 Pain in unspecified knee: Secondary | ICD-10-CM | POA: Diagnosis not present

## 2013-04-05 DIAGNOSIS — M1990 Unspecified osteoarthritis, unspecified site: Secondary | ICD-10-CM | POA: Diagnosis not present

## 2013-04-10 DIAGNOSIS — M1990 Unspecified osteoarthritis, unspecified site: Secondary | ICD-10-CM | POA: Diagnosis not present

## 2013-04-10 DIAGNOSIS — M25569 Pain in unspecified knee: Secondary | ICD-10-CM | POA: Diagnosis not present

## 2013-04-12 DIAGNOSIS — M1990 Unspecified osteoarthritis, unspecified site: Secondary | ICD-10-CM | POA: Diagnosis not present

## 2013-04-12 DIAGNOSIS — M25569 Pain in unspecified knee: Secondary | ICD-10-CM | POA: Diagnosis not present

## 2013-04-19 DIAGNOSIS — M1990 Unspecified osteoarthritis, unspecified site: Secondary | ICD-10-CM | POA: Diagnosis not present

## 2013-04-19 DIAGNOSIS — M25569 Pain in unspecified knee: Secondary | ICD-10-CM | POA: Diagnosis not present

## 2013-04-24 DIAGNOSIS — M1990 Unspecified osteoarthritis, unspecified site: Secondary | ICD-10-CM | POA: Diagnosis not present

## 2013-04-24 DIAGNOSIS — M25569 Pain in unspecified knee: Secondary | ICD-10-CM | POA: Diagnosis not present

## 2013-04-26 DIAGNOSIS — M1990 Unspecified osteoarthritis, unspecified site: Secondary | ICD-10-CM | POA: Diagnosis not present

## 2013-04-26 DIAGNOSIS — M25569 Pain in unspecified knee: Secondary | ICD-10-CM | POA: Diagnosis not present

## 2013-04-30 DIAGNOSIS — M25569 Pain in unspecified knee: Secondary | ICD-10-CM | POA: Diagnosis not present

## 2013-04-30 DIAGNOSIS — M1990 Unspecified osteoarthritis, unspecified site: Secondary | ICD-10-CM | POA: Diagnosis not present

## 2013-05-30 DIAGNOSIS — M25569 Pain in unspecified knee: Secondary | ICD-10-CM | POA: Diagnosis not present

## 2013-05-30 DIAGNOSIS — M1990 Unspecified osteoarthritis, unspecified site: Secondary | ICD-10-CM | POA: Diagnosis not present

## 2013-05-31 DIAGNOSIS — M1990 Unspecified osteoarthritis, unspecified site: Secondary | ICD-10-CM | POA: Diagnosis not present

## 2013-05-31 DIAGNOSIS — M25569 Pain in unspecified knee: Secondary | ICD-10-CM | POA: Diagnosis not present

## 2013-06-05 DIAGNOSIS — M25569 Pain in unspecified knee: Secondary | ICD-10-CM | POA: Diagnosis not present

## 2013-06-05 DIAGNOSIS — M1990 Unspecified osteoarthritis, unspecified site: Secondary | ICD-10-CM | POA: Diagnosis not present

## 2013-06-08 DIAGNOSIS — Z79899 Other long term (current) drug therapy: Secondary | ICD-10-CM | POA: Diagnosis not present

## 2013-06-08 DIAGNOSIS — Z0000 Encounter for general adult medical examination without abnormal findings: Secondary | ICD-10-CM | POA: Diagnosis not present

## 2013-06-08 DIAGNOSIS — Z9181 History of falling: Secondary | ICD-10-CM | POA: Diagnosis not present

## 2013-06-08 DIAGNOSIS — Z1331 Encounter for screening for depression: Secondary | ICD-10-CM | POA: Diagnosis not present

## 2013-06-08 DIAGNOSIS — E7800 Pure hypercholesterolemia, unspecified: Secondary | ICD-10-CM | POA: Diagnosis not present

## 2013-06-08 DIAGNOSIS — IMO0002 Reserved for concepts with insufficient information to code with codable children: Secondary | ICD-10-CM | POA: Diagnosis not present

## 2013-06-15 DIAGNOSIS — H2510 Age-related nuclear cataract, unspecified eye: Secondary | ICD-10-CM | POA: Diagnosis not present

## 2013-06-22 DIAGNOSIS — Z1231 Encounter for screening mammogram for malignant neoplasm of breast: Secondary | ICD-10-CM | POA: Diagnosis not present

## 2013-06-22 DIAGNOSIS — R922 Inconclusive mammogram: Secondary | ICD-10-CM | POA: Diagnosis not present

## 2013-06-23 DIAGNOSIS — M899 Disorder of bone, unspecified: Secondary | ICD-10-CM | POA: Diagnosis not present

## 2013-06-23 DIAGNOSIS — M949 Disorder of cartilage, unspecified: Secondary | ICD-10-CM | POA: Diagnosis not present

## 2013-06-23 DIAGNOSIS — N951 Menopausal and female climacteric states: Secondary | ICD-10-CM | POA: Diagnosis not present

## 2013-06-23 DIAGNOSIS — M810 Age-related osteoporosis without current pathological fracture: Secondary | ICD-10-CM | POA: Diagnosis not present

## 2013-06-23 DIAGNOSIS — E2839 Other primary ovarian failure: Secondary | ICD-10-CM | POA: Diagnosis not present

## 2014-05-22 DIAGNOSIS — H40039 Anatomical narrow angle, unspecified eye: Secondary | ICD-10-CM | POA: Diagnosis not present

## 2014-06-28 DIAGNOSIS — Z9181 History of falling: Secondary | ICD-10-CM | POA: Diagnosis not present

## 2014-06-28 DIAGNOSIS — Z1331 Encounter for screening for depression: Secondary | ICD-10-CM | POA: Diagnosis not present

## 2014-06-28 DIAGNOSIS — Z79899 Other long term (current) drug therapy: Secondary | ICD-10-CM | POA: Diagnosis not present

## 2014-06-28 DIAGNOSIS — M810 Age-related osteoporosis without current pathological fracture: Secondary | ICD-10-CM | POA: Diagnosis not present

## 2014-06-28 DIAGNOSIS — E7800 Pure hypercholesterolemia, unspecified: Secondary | ICD-10-CM | POA: Diagnosis not present

## 2014-06-28 DIAGNOSIS — Z0000 Encounter for general adult medical examination without abnormal findings: Secondary | ICD-10-CM | POA: Diagnosis not present

## 2014-07-05 DIAGNOSIS — R928 Other abnormal and inconclusive findings on diagnostic imaging of breast: Secondary | ICD-10-CM | POA: Diagnosis not present

## 2014-07-05 DIAGNOSIS — Z1231 Encounter for screening mammogram for malignant neoplasm of breast: Secondary | ICD-10-CM | POA: Diagnosis not present

## 2014-10-28 DIAGNOSIS — Z23 Encounter for immunization: Secondary | ICD-10-CM | POA: Diagnosis not present

## 2014-12-20 DIAGNOSIS — M722 Plantar fascial fibromatosis: Secondary | ICD-10-CM | POA: Diagnosis not present

## 2015-03-07 DIAGNOSIS — M76812 Anterior tibial syndrome, left leg: Secondary | ICD-10-CM | POA: Diagnosis not present

## 2015-05-21 DIAGNOSIS — Z6824 Body mass index (BMI) 24.0-24.9, adult: Secondary | ICD-10-CM | POA: Diagnosis not present

## 2015-05-21 DIAGNOSIS — J309 Allergic rhinitis, unspecified: Secondary | ICD-10-CM | POA: Diagnosis not present

## 2015-06-24 DIAGNOSIS — H40033 Anatomical narrow angle, bilateral: Secondary | ICD-10-CM | POA: Diagnosis not present

## 2015-07-10 DIAGNOSIS — Z1212 Encounter for screening for malignant neoplasm of rectum: Secondary | ICD-10-CM | POA: Diagnosis not present

## 2015-07-10 DIAGNOSIS — M810 Age-related osteoporosis without current pathological fracture: Secondary | ICD-10-CM | POA: Diagnosis not present

## 2015-07-10 DIAGNOSIS — K649 Unspecified hemorrhoids: Secondary | ICD-10-CM | POA: Diagnosis not present

## 2015-07-10 DIAGNOSIS — Z1389 Encounter for screening for other disorder: Secondary | ICD-10-CM | POA: Diagnosis not present

## 2015-07-10 DIAGNOSIS — Z79899 Other long term (current) drug therapy: Secondary | ICD-10-CM | POA: Diagnosis not present

## 2015-07-10 DIAGNOSIS — Z23 Encounter for immunization: Secondary | ICD-10-CM | POA: Diagnosis not present

## 2015-07-10 DIAGNOSIS — Z0000 Encounter for general adult medical examination without abnormal findings: Secondary | ICD-10-CM | POA: Diagnosis not present

## 2015-07-10 DIAGNOSIS — E780 Pure hypercholesterolemia: Secondary | ICD-10-CM | POA: Diagnosis not present

## 2015-07-15 DIAGNOSIS — Z1231 Encounter for screening mammogram for malignant neoplasm of breast: Secondary | ICD-10-CM | POA: Diagnosis not present

## 2015-07-15 DIAGNOSIS — Z13820 Encounter for screening for osteoporosis: Secondary | ICD-10-CM | POA: Diagnosis not present

## 2015-07-15 DIAGNOSIS — M810 Age-related osteoporosis without current pathological fracture: Secondary | ICD-10-CM | POA: Diagnosis not present

## 2015-08-25 DIAGNOSIS — Z23 Encounter for immunization: Secondary | ICD-10-CM | POA: Diagnosis not present

## 2015-10-17 DIAGNOSIS — Z6824 Body mass index (BMI) 24.0-24.9, adult: Secondary | ICD-10-CM | POA: Diagnosis not present

## 2015-10-17 DIAGNOSIS — J019 Acute sinusitis, unspecified: Secondary | ICD-10-CM | POA: Diagnosis not present

## 2015-10-17 DIAGNOSIS — J302 Other seasonal allergic rhinitis: Secondary | ICD-10-CM | POA: Diagnosis not present

## 2015-11-18 DIAGNOSIS — E7800 Pure hypercholesterolemia, unspecified: Secondary | ICD-10-CM | POA: Diagnosis not present

## 2015-11-18 DIAGNOSIS — Z6823 Body mass index (BMI) 23.0-23.9, adult: Secondary | ICD-10-CM | POA: Diagnosis not present

## 2015-11-18 DIAGNOSIS — Z79899 Other long term (current) drug therapy: Secondary | ICD-10-CM | POA: Diagnosis not present

## 2015-11-19 DIAGNOSIS — E7800 Pure hypercholesterolemia, unspecified: Secondary | ICD-10-CM | POA: Diagnosis not present

## 2015-11-19 DIAGNOSIS — Z79899 Other long term (current) drug therapy: Secondary | ICD-10-CM | POA: Diagnosis not present

## 2015-12-06 DIAGNOSIS — H2513 Age-related nuclear cataract, bilateral: Secondary | ICD-10-CM | POA: Diagnosis not present

## 2016-05-19 DIAGNOSIS — D229 Melanocytic nevi, unspecified: Secondary | ICD-10-CM | POA: Diagnosis not present

## 2016-05-19 DIAGNOSIS — L821 Other seborrheic keratosis: Secondary | ICD-10-CM | POA: Diagnosis not present

## 2016-05-19 DIAGNOSIS — D1800 Hemangioma unspecified site: Secondary | ICD-10-CM | POA: Diagnosis not present

## 2016-05-19 DIAGNOSIS — D485 Neoplasm of uncertain behavior of skin: Secondary | ICD-10-CM | POA: Diagnosis not present

## 2016-05-19 DIAGNOSIS — D1801 Hemangioma of skin and subcutaneous tissue: Secondary | ICD-10-CM | POA: Diagnosis not present

## 2016-05-19 DIAGNOSIS — L820 Inflamed seborrheic keratosis: Secondary | ICD-10-CM | POA: Diagnosis not present

## 2016-06-05 DIAGNOSIS — H40003 Preglaucoma, unspecified, bilateral: Secondary | ICD-10-CM | POA: Diagnosis not present

## 2016-07-20 DIAGNOSIS — E7800 Pure hypercholesterolemia, unspecified: Secondary | ICD-10-CM | POA: Diagnosis not present

## 2016-07-20 DIAGNOSIS — Z9181 History of falling: Secondary | ICD-10-CM | POA: Diagnosis not present

## 2016-07-20 DIAGNOSIS — Z1159 Encounter for screening for other viral diseases: Secondary | ICD-10-CM | POA: Diagnosis not present

## 2016-07-20 DIAGNOSIS — Z0000 Encounter for general adult medical examination without abnormal findings: Secondary | ICD-10-CM | POA: Diagnosis not present

## 2016-07-20 DIAGNOSIS — Z23 Encounter for immunization: Secondary | ICD-10-CM | POA: Diagnosis not present

## 2016-07-20 DIAGNOSIS — Z1389 Encounter for screening for other disorder: Secondary | ICD-10-CM | POA: Diagnosis not present

## 2016-07-20 DIAGNOSIS — Z79899 Other long term (current) drug therapy: Secondary | ICD-10-CM | POA: Diagnosis not present

## 2016-07-20 DIAGNOSIS — M810 Age-related osteoporosis without current pathological fracture: Secondary | ICD-10-CM | POA: Diagnosis not present

## 2016-07-20 DIAGNOSIS — Z6824 Body mass index (BMI) 24.0-24.9, adult: Secondary | ICD-10-CM | POA: Diagnosis not present

## 2016-07-22 DIAGNOSIS — Z1231 Encounter for screening mammogram for malignant neoplasm of breast: Secondary | ICD-10-CM | POA: Diagnosis not present

## 2016-08-17 DIAGNOSIS — J019 Acute sinusitis, unspecified: Secondary | ICD-10-CM | POA: Diagnosis not present

## 2016-08-17 DIAGNOSIS — Z6824 Body mass index (BMI) 24.0-24.9, adult: Secondary | ICD-10-CM | POA: Diagnosis not present

## 2016-08-26 DIAGNOSIS — J301 Allergic rhinitis due to pollen: Secondary | ICD-10-CM | POA: Diagnosis not present

## 2016-08-26 DIAGNOSIS — R0981 Nasal congestion: Secondary | ICD-10-CM | POA: Diagnosis not present

## 2016-08-26 DIAGNOSIS — K5010 Crohn's disease of large intestine without complications: Secondary | ICD-10-CM | POA: Diagnosis not present

## 2016-09-13 DIAGNOSIS — Z23 Encounter for immunization: Secondary | ICD-10-CM | POA: Diagnosis not present

## 2016-10-27 DIAGNOSIS — M1811 Unilateral primary osteoarthritis of first carpometacarpal joint, right hand: Secondary | ICD-10-CM | POA: Diagnosis not present

## 2016-10-27 DIAGNOSIS — M67912 Unspecified disorder of synovium and tendon, left shoulder: Secondary | ICD-10-CM | POA: Diagnosis not present

## 2017-01-25 DIAGNOSIS — Z79899 Other long term (current) drug therapy: Secondary | ICD-10-CM | POA: Diagnosis not present

## 2017-01-25 DIAGNOSIS — E7800 Pure hypercholesterolemia, unspecified: Secondary | ICD-10-CM | POA: Diagnosis not present

## 2017-01-25 DIAGNOSIS — M810 Age-related osteoporosis without current pathological fracture: Secondary | ICD-10-CM | POA: Diagnosis not present

## 2017-01-25 DIAGNOSIS — Z6825 Body mass index (BMI) 25.0-25.9, adult: Secondary | ICD-10-CM | POA: Diagnosis not present

## 2017-01-25 DIAGNOSIS — R05 Cough: Secondary | ICD-10-CM | POA: Diagnosis not present

## 2017-01-25 DIAGNOSIS — R030 Elevated blood-pressure reading, without diagnosis of hypertension: Secondary | ICD-10-CM | POA: Diagnosis not present

## 2017-02-08 DIAGNOSIS — Z6825 Body mass index (BMI) 25.0-25.9, adult: Secondary | ICD-10-CM | POA: Diagnosis not present

## 2017-02-08 DIAGNOSIS — I10 Essential (primary) hypertension: Secondary | ICD-10-CM | POA: Diagnosis not present

## 2017-06-08 DIAGNOSIS — H2513 Age-related nuclear cataract, bilateral: Secondary | ICD-10-CM | POA: Diagnosis not present

## 2017-08-05 DIAGNOSIS — Z79899 Other long term (current) drug therapy: Secondary | ICD-10-CM | POA: Diagnosis not present

## 2017-08-05 DIAGNOSIS — Z0000 Encounter for general adult medical examination without abnormal findings: Secondary | ICD-10-CM | POA: Diagnosis not present

## 2017-08-05 DIAGNOSIS — Z1231 Encounter for screening mammogram for malignant neoplasm of breast: Secondary | ICD-10-CM | POA: Diagnosis not present

## 2017-08-05 DIAGNOSIS — E7800 Pure hypercholesterolemia, unspecified: Secondary | ICD-10-CM | POA: Diagnosis not present

## 2017-08-05 DIAGNOSIS — Z23 Encounter for immunization: Secondary | ICD-10-CM | POA: Diagnosis not present

## 2017-08-05 DIAGNOSIS — I10 Essential (primary) hypertension: Secondary | ICD-10-CM | POA: Diagnosis not present

## 2017-08-05 DIAGNOSIS — Z6825 Body mass index (BMI) 25.0-25.9, adult: Secondary | ICD-10-CM | POA: Diagnosis not present

## 2017-08-05 DIAGNOSIS — M810 Age-related osteoporosis without current pathological fracture: Secondary | ICD-10-CM | POA: Diagnosis not present

## 2017-08-17 DIAGNOSIS — Z1231 Encounter for screening mammogram for malignant neoplasm of breast: Secondary | ICD-10-CM | POA: Diagnosis not present

## 2017-09-13 DIAGNOSIS — Z23 Encounter for immunization: Secondary | ICD-10-CM | POA: Diagnosis not present

## 2017-10-18 DIAGNOSIS — Z6825 Body mass index (BMI) 25.0-25.9, adult: Secondary | ICD-10-CM | POA: Diagnosis not present

## 2017-10-18 DIAGNOSIS — J019 Acute sinusitis, unspecified: Secondary | ICD-10-CM | POA: Diagnosis not present

## 2018-01-03 DIAGNOSIS — H1851 Endothelial corneal dystrophy: Secondary | ICD-10-CM | POA: Diagnosis not present

## 2018-07-09 DIAGNOSIS — M65332 Trigger finger, left middle finger: Secondary | ICD-10-CM | POA: Diagnosis not present

## 2018-08-09 DIAGNOSIS — Z0000 Encounter for general adult medical examination without abnormal findings: Secondary | ICD-10-CM | POA: Diagnosis not present

## 2018-08-09 DIAGNOSIS — Z1231 Encounter for screening mammogram for malignant neoplasm of breast: Secondary | ICD-10-CM | POA: Diagnosis not present

## 2018-08-09 DIAGNOSIS — Z6825 Body mass index (BMI) 25.0-25.9, adult: Secondary | ICD-10-CM | POA: Diagnosis not present

## 2018-08-09 DIAGNOSIS — E7800 Pure hypercholesterolemia, unspecified: Secondary | ICD-10-CM | POA: Diagnosis not present

## 2018-08-09 DIAGNOSIS — Z1331 Encounter for screening for depression: Secondary | ICD-10-CM | POA: Diagnosis not present

## 2018-08-09 DIAGNOSIS — I10 Essential (primary) hypertension: Secondary | ICD-10-CM | POA: Diagnosis not present

## 2018-08-09 DIAGNOSIS — M810 Age-related osteoporosis without current pathological fracture: Secondary | ICD-10-CM | POA: Diagnosis not present

## 2018-08-09 DIAGNOSIS — J302 Other seasonal allergic rhinitis: Secondary | ICD-10-CM | POA: Diagnosis not present

## 2018-08-09 DIAGNOSIS — Z9181 History of falling: Secondary | ICD-10-CM | POA: Diagnosis not present

## 2018-08-22 DIAGNOSIS — M810 Age-related osteoporosis without current pathological fracture: Secondary | ICD-10-CM | POA: Diagnosis not present

## 2018-08-22 DIAGNOSIS — Z1231 Encounter for screening mammogram for malignant neoplasm of breast: Secondary | ICD-10-CM | POA: Diagnosis not present

## 2018-08-22 DIAGNOSIS — Z780 Asymptomatic menopausal state: Secondary | ICD-10-CM | POA: Diagnosis not present

## 2018-08-23 DIAGNOSIS — Z6825 Body mass index (BMI) 25.0-25.9, adult: Secondary | ICD-10-CM | POA: Diagnosis not present

## 2018-08-23 DIAGNOSIS — I10 Essential (primary) hypertension: Secondary | ICD-10-CM | POA: Diagnosis not present

## 2018-08-23 DIAGNOSIS — Z1339 Encounter for screening examination for other mental health and behavioral disorders: Secondary | ICD-10-CM | POA: Diagnosis not present

## 2018-09-04 DIAGNOSIS — Z23 Encounter for immunization: Secondary | ICD-10-CM | POA: Diagnosis not present

## 2018-09-07 DIAGNOSIS — I10 Essential (primary) hypertension: Secondary | ICD-10-CM | POA: Diagnosis not present

## 2018-09-07 DIAGNOSIS — Z6825 Body mass index (BMI) 25.0-25.9, adult: Secondary | ICD-10-CM | POA: Diagnosis not present

## 2018-10-12 DIAGNOSIS — Z6825 Body mass index (BMI) 25.0-25.9, adult: Secondary | ICD-10-CM | POA: Diagnosis not present

## 2018-10-12 DIAGNOSIS — I10 Essential (primary) hypertension: Secondary | ICD-10-CM | POA: Diagnosis not present

## 2018-11-04 DIAGNOSIS — H1851 Endothelial corneal dystrophy: Secondary | ICD-10-CM | POA: Diagnosis not present

## 2018-11-04 DIAGNOSIS — H2513 Age-related nuclear cataract, bilateral: Secondary | ICD-10-CM | POA: Diagnosis not present

## 2019-04-05 DIAGNOSIS — E7800 Pure hypercholesterolemia, unspecified: Secondary | ICD-10-CM | POA: Diagnosis not present

## 2019-04-05 DIAGNOSIS — K5090 Crohn's disease, unspecified, without complications: Secondary | ICD-10-CM | POA: Diagnosis not present

## 2019-04-05 DIAGNOSIS — I10 Essential (primary) hypertension: Secondary | ICD-10-CM | POA: Diagnosis not present

## 2019-04-10 DIAGNOSIS — Z0000 Encounter for general adult medical examination without abnormal findings: Secondary | ICD-10-CM | POA: Diagnosis not present

## 2019-04-10 DIAGNOSIS — Z9181 History of falling: Secondary | ICD-10-CM | POA: Diagnosis not present

## 2019-04-10 DIAGNOSIS — Z1331 Encounter for screening for depression: Secondary | ICD-10-CM | POA: Diagnosis not present

## 2019-04-10 DIAGNOSIS — E785 Hyperlipidemia, unspecified: Secondary | ICD-10-CM | POA: Diagnosis not present

## 2019-04-19 DIAGNOSIS — I10 Essential (primary) hypertension: Secondary | ICD-10-CM | POA: Diagnosis not present

## 2019-04-19 DIAGNOSIS — E7800 Pure hypercholesterolemia, unspecified: Secondary | ICD-10-CM | POA: Diagnosis not present

## 2019-07-10 DIAGNOSIS — L821 Other seborrheic keratosis: Secondary | ICD-10-CM | POA: Diagnosis not present

## 2019-07-10 DIAGNOSIS — L820 Inflamed seborrheic keratosis: Secondary | ICD-10-CM | POA: Diagnosis not present

## 2019-07-10 DIAGNOSIS — R238 Other skin changes: Secondary | ICD-10-CM | POA: Diagnosis not present

## 2019-08-25 DIAGNOSIS — Z1231 Encounter for screening mammogram for malignant neoplasm of breast: Secondary | ICD-10-CM | POA: Diagnosis not present

## 2019-08-27 DIAGNOSIS — Z23 Encounter for immunization: Secondary | ICD-10-CM | POA: Diagnosis not present

## 2019-10-04 DIAGNOSIS — E7800 Pure hypercholesterolemia, unspecified: Secondary | ICD-10-CM | POA: Diagnosis not present

## 2019-10-04 DIAGNOSIS — I10 Essential (primary) hypertension: Secondary | ICD-10-CM | POA: Diagnosis not present

## 2019-10-04 DIAGNOSIS — R351 Nocturia: Secondary | ICD-10-CM | POA: Diagnosis not present

## 2019-10-04 DIAGNOSIS — R5383 Other fatigue: Secondary | ICD-10-CM | POA: Diagnosis not present

## 2019-10-04 DIAGNOSIS — Z139 Encounter for screening, unspecified: Secondary | ICD-10-CM | POA: Diagnosis not present

## 2019-12-15 DIAGNOSIS — H2513 Age-related nuclear cataract, bilateral: Secondary | ICD-10-CM | POA: Diagnosis not present

## 2020-02-22 DIAGNOSIS — M65311 Trigger thumb, right thumb: Secondary | ICD-10-CM | POA: Diagnosis not present

## 2020-03-14 DIAGNOSIS — M65342 Trigger finger, left ring finger: Secondary | ICD-10-CM | POA: Diagnosis not present

## 2020-04-02 DIAGNOSIS — R351 Nocturia: Secondary | ICD-10-CM | POA: Diagnosis not present

## 2020-04-02 DIAGNOSIS — I10 Essential (primary) hypertension: Secondary | ICD-10-CM | POA: Diagnosis not present

## 2020-04-02 DIAGNOSIS — H18519 Endothelial corneal dystrophy, unspecified eye: Secondary | ICD-10-CM | POA: Diagnosis not present

## 2020-04-02 DIAGNOSIS — E7800 Pure hypercholesterolemia, unspecified: Secondary | ICD-10-CM | POA: Diagnosis not present

## 2020-04-11 DIAGNOSIS — Z9181 History of falling: Secondary | ICD-10-CM | POA: Diagnosis not present

## 2020-04-11 DIAGNOSIS — Z0000 Encounter for general adult medical examination without abnormal findings: Secondary | ICD-10-CM | POA: Diagnosis not present

## 2020-04-11 DIAGNOSIS — Z1331 Encounter for screening for depression: Secondary | ICD-10-CM | POA: Diagnosis not present

## 2020-04-11 DIAGNOSIS — E785 Hyperlipidemia, unspecified: Secondary | ICD-10-CM | POA: Diagnosis not present

## 2020-04-23 ENCOUNTER — Emergency Department: Payer: Medicare Other

## 2020-04-23 ENCOUNTER — Encounter: Payer: Self-pay | Admitting: *Deleted

## 2020-04-23 ENCOUNTER — Observation Stay
Admission: EM | Admit: 2020-04-23 | Discharge: 2020-04-24 | Disposition: A | Discharge status: Home / Self Care | Payer: Medicare Other | Attending: Internal Medicine | Admitting: Internal Medicine

## 2020-04-23 ENCOUNTER — Other Ambulatory Visit: Payer: Self-pay

## 2020-04-23 DIAGNOSIS — R55 Syncope and collapse: Secondary | ICD-10-CM | POA: Diagnosis not present

## 2020-04-23 DIAGNOSIS — S06359A Traumatic hemorrhage of left cerebrum with loss of consciousness of unspecified duration, initial encounter: Secondary | ICD-10-CM | POA: Insufficient documentation

## 2020-04-23 DIAGNOSIS — I10 Essential (primary) hypertension: Secondary | ICD-10-CM | POA: Diagnosis not present

## 2020-04-23 DIAGNOSIS — S0990XA Unspecified injury of head, initial encounter: Secondary | ICD-10-CM

## 2020-04-23 DIAGNOSIS — Z882 Allergy status to sulfonamides status: Secondary | ICD-10-CM | POA: Diagnosis not present

## 2020-04-23 DIAGNOSIS — W2209XA Striking against other stationary object, initial encounter: Secondary | ICD-10-CM | POA: Insufficient documentation

## 2020-04-23 DIAGNOSIS — Z20822 Contact with and (suspected) exposure to covid-19: Secondary | ICD-10-CM | POA: Insufficient documentation

## 2020-04-23 DIAGNOSIS — S0101XA Laceration without foreign body of scalp, initial encounter: Secondary | ICD-10-CM | POA: Diagnosis not present

## 2020-04-23 DIAGNOSIS — S0181XA Laceration without foreign body of other part of head, initial encounter: Secondary | ICD-10-CM

## 2020-04-23 DIAGNOSIS — I619 Nontraumatic intracerebral hemorrhage, unspecified: Secondary | ICD-10-CM

## 2020-04-23 DIAGNOSIS — Z886 Allergy status to analgesic agent status: Secondary | ICD-10-CM | POA: Insufficient documentation

## 2020-04-23 DIAGNOSIS — Y92008 Other place in unspecified non-institutional (private) residence as the place of occurrence of the external cause: Secondary | ICD-10-CM | POA: Insufficient documentation

## 2020-04-23 DIAGNOSIS — M19012 Primary osteoarthritis, left shoulder: Secondary | ICD-10-CM | POA: Insufficient documentation

## 2020-04-23 DIAGNOSIS — Z79899 Other long term (current) drug therapy: Secondary | ICD-10-CM | POA: Diagnosis not present

## 2020-04-23 DIAGNOSIS — R011 Cardiac murmur, unspecified: Secondary | ICD-10-CM | POA: Diagnosis not present

## 2020-04-23 DIAGNOSIS — K5090 Crohn's disease, unspecified, without complications: Secondary | ICD-10-CM | POA: Diagnosis not present

## 2020-04-23 DIAGNOSIS — W109XXA Fall (on) (from) unspecified stairs and steps, initial encounter: Secondary | ICD-10-CM | POA: Insufficient documentation

## 2020-04-23 DIAGNOSIS — Z87891 Personal history of nicotine dependence: Secondary | ICD-10-CM | POA: Insufficient documentation

## 2020-04-23 DIAGNOSIS — Y9389 Activity, other specified: Secondary | ICD-10-CM | POA: Diagnosis not present

## 2020-04-23 DIAGNOSIS — K219 Gastro-esophageal reflux disease without esophagitis: Secondary | ICD-10-CM | POA: Insufficient documentation

## 2020-04-23 DIAGNOSIS — Z888 Allergy status to other drugs, medicaments and biological substances status: Secondary | ICD-10-CM | POA: Diagnosis not present

## 2020-04-23 DIAGNOSIS — R0789 Other chest pain: Secondary | ICD-10-CM | POA: Diagnosis not present

## 2020-04-23 DIAGNOSIS — R519 Headache, unspecified: Secondary | ICD-10-CM | POA: Diagnosis not present

## 2020-04-23 DIAGNOSIS — W19XXXA Unspecified fall, initial encounter: Secondary | ICD-10-CM

## 2020-04-23 DIAGNOSIS — E785 Hyperlipidemia, unspecified: Secondary | ICD-10-CM | POA: Diagnosis not present

## 2020-04-23 LAB — BASIC METABOLIC PANEL
Anion gap: 8 (ref 5–15)
BUN: 17 mg/dL (ref 8–23)
CO2: 28 mmol/L (ref 22–32)
Calcium: 10 mg/dL (ref 8.9–10.3)
Chloride: 104 mmol/L (ref 98–111)
Creatinine, Ser: 0.96 mg/dL (ref 0.44–1.00)
GFR calc Af Amer: >60 mL/min (ref >60)
GFR calc non Af Amer: 58 mL/min — ABNORMAL LOW (ref >60)
Glucose, Bld: 104 mg/dL — ABNORMAL HIGH (ref 70–99)
Potassium: 4 mmol/L (ref 3.5–5.1)
Sodium: 140 mmol/L (ref 135–145)

## 2020-04-23 LAB — CBC
HCT: 37.4 % (ref 36.0–46.0)
Hemoglobin: 12.3 g/dL (ref 12.0–15.0)
MCH: 31.7 pg (ref 26.0–34.0)
MCHC: 32.9 g/dL (ref 30.0–36.0)
MCV: 96.4 fL (ref 80.0–100.0)
Platelets: 357 10*3/uL (ref 150–400)
RBC: 3.88 MIL/uL (ref 3.87–5.11)
RDW: 13.2 % (ref 11.5–15.5)
WBC: 14.8 10*3/uL — ABNORMAL HIGH (ref 4.0–10.5)
nRBC: 0 % (ref 0.0–0.2)

## 2020-04-23 LAB — TROPONIN I (HIGH SENSITIVITY): Troponin I (High Sensitivity): 7 ng/L (ref <18)

## 2020-04-23 LAB — SARS CORONAVIRUS 2 BY RT PCR (HOSPITAL ORDER, PERFORMED IN ~~LOC~~ HOSPITAL LAB): SARS Coronavirus 2: NEGATIVE

## 2020-04-23 MED ORDER — LIDOCAINE 5 % EX PTCH
1.0000 | MEDICATED_PATCH | CUTANEOUS | Status: DC
  Administered 2020-04-23: 1 via TRANSDERMAL
  Filled 2020-04-23: qty 1

## 2020-04-23 MED ORDER — SODIUM CHLORIDE 0.9% FLUSH: 3.0000 mL | Freq: Once | INTRAVENOUS | Status: DC

## 2020-04-23 MED ORDER — LIDOCAINE-EPINEPHRINE 2 %-1:100000 IJ SOLN
20.0000 mL | Freq: Once | INTRAMUSCULAR | Status: AC
  Administered 2020-04-23: 20 mL
  Filled 2020-04-23: qty 1

## 2020-04-23 MED ORDER — ACETAMINOPHEN 500 MG PO TABS
1000.0000 mg | ORAL_TABLET | Freq: Once | ORAL | Status: AC
  Administered 2020-04-23: 1000 mg via ORAL
  Filled 2020-04-23: qty 2

## 2020-04-23 NOTE — ED Triage Notes (Addendum)
Pt to triage via wheelchair.  Pt fell going out the door onto a  cement floor.  No loc  No vomiting  Hematoma to left side of face with a laceration.  Bleeding controlled.  Pt also has left arm pain from the fall.  No swelling or deformity noted  Pt alert  Speech clear.  Pt and family unsure why pt fell today.  md aware and states do blood work and ekg on pt with scans.

## 2020-04-23 NOTE — ED Provider Notes (Signed)
[facility] Emergency Department Provider Note   ____________________________________________   First MD Initiated Contact with Patient 04/23/20 2101     (approximate)  I have reviewed the triage vital signs and the nursing notes.   HISTORY  Chief Complaint Fall, Head Injury, and Laceration    HPI Leah Mitchell is a 74 y.o. female with possible history of Crohn's disease who presents to the ED following fall.  Patient reports that just prior to arrival Leah Mitchell was walking out her front door and down the steps when Leah Mitchell suddenly fell.  Leah Mitchell hit her head and lost consciousness for a few seconds, was found on the ground by family.  Leah Mitchell noticed a laceration to her left frontal scalp and was brought to the ED for further evaluation.  Leah Mitchell currently complains of pain around the laceration, but otherwise denies any neck pain.  Leah Mitchell also complains of some pain at her left shoulder and left lateral chest wall, denies any abdominal pain, hip pain, or other extremity pain.  Leah Mitchell does not take any blood thinners.        Past Medical History:  Diagnosis Date  . Back pain   . Crohn disease ([place])   . GERD (gastroesophageal reflux disease)    no meds  . Wears glasses   . Wears partial dentures    upper partial    There are no problems to display for this patient.   Past Surgical History:  Procedure Laterality Date  . BACK SURGERY  91,2001   lumb lam  . KNEE ARTHROSCOPY  2010   right   . KNEE ARTHROSCOPY Left 03/24/2013   Procedure: ARTHROSCOPY KNEE MEDIAL PLICA EXCISION, AND  PARTIAL MEDIAL MENISECTOMY;  Surgeon: Alta Corning, MD;  Location: [place];  Service: Orthopedics;  Laterality: Left;  . LUMBAR LAMINECTOMY  2012   micerodiskectomy  . TONSILLECTOMY    . TRIGGER FINGER RELEASE  2006   rt long  . UPPER GI ENDOSCOPY      Prior to Admission medications   Medication Sig Start Date End Date Taking? Authorizing Provider  acetaminophen  (TYLENOL) 500 MG tablet Take 500 mg by mouth every 6 (six) hours as needed for pain.    [provider]  Ascorbic Acid (VITAMIN C PO) Take 1 tablet by mouth daily.    [provider]  B Complex Vitamins (VITAMIN B-COMPLEX PO) Take 1 tablet by mouth daily.    [provider]  CALCIUM PO Take 1 tablet by mouth 2 (two) times daily.    [provider]  Cholecalciferol (VITAMIN D-3 PO) Take 1 tablet by mouth daily.    [provider]  CINNAMON PO Take 1 tablet by mouth 2 (two) times daily.    [provider]  diazepam (VALIUM) 5 MG tablet Take 1 tablet (5 mg total) by mouth 2 (two) times daily as needed (muscle spasm). 03/21/13   Dorie Rank, MD  fish oil-omega-3 fatty acids 1000 MG capsule Take 1 g by mouth 2 (two) times daily.    [provider]  Multiple Vitamin (MULTIVITAMIN WITH MINERALS) TABS Take 1 tablet by mouth daily.    [provider]  oxyCODONE-acetaminophen (PERCOCET/ROXICET) 5-325 MG per tablet Take 1-2 tablets by mouth every 6 (six) hours as needed for pain. 03/21/13   Dorie Rank, MD  Probiotic Product (PROBIOTIC DAILY PO) Take 1 tablet by mouth daily.    [provider]  Red Yeast Rice Extract (RED YEAST  RICE PO) Take 1 capsule by mouth 2 (two) times daily.    [provider]  traMADol (ULTRAM) 50 MG tablet Take 50-100 mg by mouth every 6 (six) hours as needed for pain.    [provider]  traMADol (ULTRAM) 50 MG tablet Take 1-2 tablets (50-100 mg total) by mouth every 6 (six) hours as needed for pain. 03/24/13   Gary Fleet, PA-C  vitamin E 400 UNIT capsule Take 400 Units by mouth daily.    [provider]    Allergies Aspirin, Prednisone, and Sulfa antibiotics  No family history on file.  Social History Social History   Tobacco Use  . Smoking status: Former Smoker    Quit date: 03/23/1973    Years since quitting: 47.1  . Smokeless tobacco: Never Used  Substance Use  Topics  . Alcohol use: No  . Drug use: No    Review of Systems  Constitutional: No fever/chills Eyes: No visual changes. ENT: No sore throat. Cardiovascular: Denies chest pain. Respiratory: Denies shortness of breath. Gastrointestinal: No abdominal pain.  No nausea, no vomiting.  No diarrhea.  No constipation. Genitourinary: Negative for dysuria. Musculoskeletal: Negative for back pain. Skin: Negative for rash.  Positive for laceration. Neurological: Positive for headache, negative for focal weakness or numbness.  ____________________________________________   PHYSICAL EXAM:  VITAL SIGNS: ED Triage Vitals  Enc Vitals Group     BP 04/23/20 1933 (!) 166/82     Pulse Rate 04/23/20 1933 74     Resp 04/23/20 1933 20     Temp 04/23/20 1933 98.9 F (37.2 C)     Temp Source 04/23/20 1933 Oral     SpO2 04/23/20 1933 99 %     Weight 04/23/20 1934 150 lb (68 kg)     Height 04/23/20 1934 5\' 6"  (1.676 m)     Head Circumference --      Peak Flow --      Pain Score 04/23/20 1933 0     Pain Loc --      Pain Edu? --      Excl. in [place]? --     Constitutional: Alert and oriented. Eyes: Conjunctivae are normal. Head: 2 cm laceration to left frontal scalp with no active bleeding, small associated hematoma. Nose: No congestion/rhinnorhea. Mouth/Throat: Mucous membranes are moist. Neck: Normal ROM Cardiovascular: Normal rate, regular rhythm. Grossly normal heart sounds. Respiratory: Normal respiratory effort.  No retractions. Lungs CTAB.  Tenderness to left lateral chest wall. Gastrointestinal: Soft and nontender. No distention. Genitourinary: deferred Musculoskeletal: Diffuse tenderness to left shoulder.  No hip tenderness bilaterally or lower extremity tenderness. Neurologic:  Normal speech and language. No gross focal neurologic deficits are appreciated. Skin:  Skin is warm, dry and intact. No rash noted. Psychiatric: Mood and affect are normal. Speech and behavior are  normal.  ____________________________________________   LABS (all labs ordered are listed, but only abnormal results are displayed)  Labs Reviewed  BASIC METABOLIC PANEL - Abnormal; Notable for the following components:      Result Value   Glucose, Bld 104 (*)    GFR calc non Af Amer 58 (*)    All other components within normal limits  CBC - Abnormal; Notable for the following components:   WBC 14.8 (*)    All other components within normal limits  SARS CORONAVIRUS 2 BY RT PCR (HOSPITAL ORDER, [facility])  TROPONIN I (HIGH SENSITIVITY)  TROPONIN I (HIGH SENSITIVITY)   ____________________________________________  EKG  ED ECG REPORT I, Blake Divine, the attending physician, personally viewed and interpreted this ECG.   Date: 04/23/2020  EKG Time: 19:49  Rate: 77  Rhythm: normal sinus rhythm  Axis: Normal  Intervals:none  ST&T Change: None   PROCEDURES  Procedure(s) performed (including Critical Care):  .Critical Care Performed by: Blake Divine, MD Authorized by: Blake Divine, MD   Critical care provider statement:    Critical care time (minutes):  45   Critical care time was exclusive of:  Separately billable procedures and treating other patients and teaching time   Critical care was necessary to treat or prevent imminent or life-threatening deterioration of the following conditions:  Trauma   Critical care was time spent personally by me on the following activities:  Discussions with consultants, evaluation of patient's response to treatment, examination of patient, ordering and performing treatments and interventions, ordering and review of laboratory studies, ordering and review of radiographic studies, pulse oximetry, re-evaluation of patient's condition, obtaining history from patient or surrogate and review of old charts   I assumed direction of critical care for this patient from another provider in my specialty: no    .Marland KitchenLaceration Repair  Date/Time: 04/23/2020 11:05 PM Performed by: Blake Divine, MD Authorized by: Blake Divine, MD   Consent:    Consent obtained:  Verbal   Consent given by:  Patient Anesthesia (see MAR for exact dosages):    Anesthesia method:  Local infiltration   Local anesthetic:  Lidocaine 2% WITH epi Laceration details:    Location:  Scalp   Scalp location:  Frontal   Length (cm):  2 Repair type:    Repair type:  Simple Pre-procedure details:    Preparation:  Patient was prepped and draped in usual sterile fashion Exploration:    Contaminated: no   Treatment:    Area cleansed with:  Saline   Amount of cleaning:  Standard   Irrigation solution:  Sterile saline   Irrigation method:  Pressure wash   Visualized foreign bodies/material removed: no   Skin repair:    Repair method:  Sutures   Suture size:  4-0   Suture material:  Nylon   Suture technique:  Simple interrupted   Number of sutures:  3 Approximation:    Approximation:  Close Post-procedure details:    Dressing:  Open (no dressing)   Patient tolerance of procedure:  Tolerated well, no immediate complications     ____________________________________________   INITIAL IMPRESSION / ASSESSMENT AND PLAN / ED COURSE       75 year old female presents to the ED after falling down 3-4 steps in front of her house, striking her head and having a brief LOC.  CT head obtained from triage is concerning for very small SAH versus intraparenchymal hemorrhage, CT C-spine is negative for acute process.  Patient is alert and oriented with no focal neurologic deficits.  Imaging obtained of left shoulder and chest is negative for acute process.  Laceration to left frontal scalp was repaired without difficulty.  Case was discussed with Dr. Lacinda Axon of neurosurgery, who after reviewing CT head imaging states that reported hemorrhage appears very small if at all present.  Given this, and patient's reassuring neurologic exam,  Leah Mitchell would be appropriate for observation here at [facility].  Leah Mitchell will undergo repeat CT 6 hours out and at least 24 hours of observation.  Case was discussed with hospitalist for admission.      ____________________________________________   FINAL CLINICAL IMPRESSION(S) / ED DIAGNOSES  Final diagnoses:  Fall, initial encounter  Laceration of scalp, initial encounter  Injury of head, initial encounter     ED Discharge Orders    None       Note:  This document was prepared using Dragon voice recognition software and may include unintentional dictation errors.   Blake Divine, MD 04/23/20 [phone]

## 2020-04-24 ENCOUNTER — Observation Stay: Payer: Medicare Other

## 2020-04-24 ENCOUNTER — Observation Stay
Admit: 2020-04-24 | Discharge: 2020-04-24 | Disposition: A | Discharge status: Home / Self Care | Payer: Medicare Other | Attending: Family Medicine | Admitting: Family Medicine

## 2020-04-24 DIAGNOSIS — I10 Essential (primary) hypertension: Secondary | ICD-10-CM | POA: Diagnosis not present

## 2020-04-24 DIAGNOSIS — S0181XA Laceration without foreign body of other part of head, initial encounter: Secondary | ICD-10-CM | POA: Diagnosis not present

## 2020-04-24 DIAGNOSIS — R55 Syncope and collapse: Secondary | ICD-10-CM | POA: Diagnosis not present

## 2020-04-24 DIAGNOSIS — E785 Hyperlipidemia, unspecified: Secondary | ICD-10-CM

## 2020-04-24 DIAGNOSIS — I619 Nontraumatic intracerebral hemorrhage, unspecified: Secondary | ICD-10-CM

## 2020-04-24 DIAGNOSIS — W19XXXA Unspecified fall, initial encounter: Secondary | ICD-10-CM | POA: Insufficient documentation

## 2020-04-24 LAB — BASIC METABOLIC PANEL
Anion gap: 8 (ref 5–15)
BUN: 14 mg/dL (ref 8–23)
CO2: 24 mmol/L (ref 22–32)
Calcium: 9.3 mg/dL (ref 8.9–10.3)
Chloride: 108 mmol/L (ref 98–111)
Creatinine, Ser: 0.74 mg/dL (ref 0.44–1.00)
GFR calc Af Amer: >60 mL/min (ref >60)
GFR calc non Af Amer: >60 mL/min (ref >60)
Glucose, Bld: 91 mg/dL (ref 70–99)
Potassium: 3.8 mmol/L (ref 3.5–5.1)
Sodium: 140 mmol/L (ref 135–145)

## 2020-04-24 LAB — ECHOCARDIOGRAM COMPLETE
Height: 66 in
Weight: 2400 oz

## 2020-04-24 LAB — CBC
HCT: 36 % (ref 36.0–46.0)
Hemoglobin: 11.8 g/dL — ABNORMAL LOW (ref 12.0–15.0)
MCH: 31.2 pg (ref 26.0–34.0)
MCHC: 32.8 g/dL (ref 30.0–36.0)
MCV: 95.2 fL (ref 80.0–100.0)
Platelets: 323 10*3/uL (ref 150–400)
RBC: 3.78 MIL/uL — ABNORMAL LOW (ref 3.87–5.11)
RDW: 13.3 % (ref 11.5–15.5)
WBC: 12.2 10*3/uL — ABNORMAL HIGH (ref 4.0–10.5)
nRBC: 0 % (ref 0.0–0.2)

## 2020-04-24 LAB — TSH: TSH: 1.028 u[IU]/mL (ref 0.350–4.500)

## 2020-04-24 MED ORDER — TRAMADOL HCL 50 MG PO TABS: 50.0000 mg | ORAL_TABLET | Freq: Four times a day (QID) | ORAL | Status: DC | PRN

## 2020-04-24 MED ORDER — ATORVASTATIN CALCIUM 20 MG PO TABS: 10.0000 mg | ORAL_TABLET | Freq: Every day | ORAL | Status: DC

## 2020-04-24 MED ORDER — AMLODIPINE BESYLATE 5 MG PO TABS
5.0000 mg | ORAL_TABLET | Freq: Every day | ORAL | Status: DC
  Filled 2020-04-24: qty 1

## 2020-04-24 MED ORDER — ACETAMINOPHEN 500 MG PO TABS: 500.0000 mg | ORAL_TABLET | Freq: Four times a day (QID) | ORAL | Status: DC | PRN

## 2020-04-24 NOTE — Progress Notes (Signed)
*  PRELIMINARY RESULTS* Echocardiogram 2D Echocardiogram has been performed.  Sherrie Sport 04/24/2020, 11:19 AM

## 2020-04-24 NOTE — ED Notes (Signed)
Pt asking about breakfast. Pt provided a menu.

## 2020-04-24 NOTE — H&P (Signed)
History and Physical    Leah Mitchell [id number] DOB: [birth year] DOA: 04/23/2020  PCP: Philmore Pali, NP  Patient coming from: Home  I have personally briefly reviewed patient's old medical records in [place]  Chief Complaint: Syncope/loss of consciousness  HPI: Leah Mitchell is a 74 y.o. female with medical history significant for Crohn's disease, hypertension and hyperlipidemia who presents with concerns of syncope and loss of consciousness.   Patient reports that she was opening a door to go to her porch to call her dog into the house and the next thing she recalls is her son calling her name but she laid on the floor.  Unsure for how long she had loss of consciousness.  She denies feeling dizziness, lightheadedness, chest pain or palpitations or shortness of breath prior to episode.  No nausea or vomiting.  No bowel or bladder incontinence.  States that she was otherwise in her normal state of health.  Had good appetite.  Endorsed compliant with her medication.  Has never had any episode of syncope like this.  ED Course: She was afebrile, mildly hypertensive with systolic up to [id number] on room air.  She was noted to have a left temporal laceration.  CT head shows small focal hypodensity in the inferior left frontal lobe suspicious for small volume hemorrhage.  ED physician Dr. Charna Archer discussed case with neurosurgery Dr. Lacinda Axon who is not overly concerned that this is an actual hemorrhage based on his review of the imaging but still recommends repeat CT head in 6 hours to follow-up.  Lab work notable for leukocytosis of 14.8 but no anemia.  BMP overall largely unremarkable.  EKG shows normal sinus rhythm.  Review of Systems:  Constitutional: No Weight Change, No Fever ENT/Mouth: No sore throat, No Rhinorrhea Eyes: No Eye Pain, No Vision Changes Cardiovascular: No Chest Pain, no SOB,  No Palpitations Respiratory: No Cough, No Sputum Gastrointestinal: No Nausea, No Vomiting, No  Diarrhea, No Constipation, No Pain Genitourinary: no Urinary Incontinence,  Musculoskeletal: No Arthralgias, No Myalgias Skin: No Skin Lesions, No Pruritus, Neuro: no Weakness, No Numbness,  +Loss of Consciousness, + Syncope Psych: No Anxiety/Panic, No Depression, no decrease appetite Heme/Lymph: No Bruising, No Bleeding Past Medical History:  Diagnosis Date  . Back pain   . Crohn disease ([place])   . GERD (gastroesophageal reflux disease)    no meds  . Wears glasses   . Wears partial dentures    upper partial    Past Surgical History:  Procedure Laterality Date  . BACK SURGERY  91,2001   lumb lam  . KNEE ARTHROSCOPY  2010   right   . KNEE ARTHROSCOPY Left 03/24/2013   Procedure: ARTHROSCOPY KNEE MEDIAL PLICA EXCISION, AND  PARTIAL MEDIAL MENISECTOMY;  Surgeon: Alta Corning, MD;  Location: [place];  Service: Orthopedics;  Laterality: Left;  . LUMBAR LAMINECTOMY  2012   micerodiskectomy  . TONSILLECTOMY    . TRIGGER FINGER RELEASE  2006   rt long  . UPPER GI ENDOSCOPY       reports that she quit smoking about 47 years ago. She has never used smokeless tobacco. She reports that she does not drink alcohol or use drugs.  Allergies  Allergen Reactions  . Aspirin Other (See Comments)    Causes constipation   . Prednisone Rash  . Sulfa Antibiotics Rash    No family history on file.   Prior to Admission medications   Medication Sig Start Date End  Date Taking? Authorizing Provider  acetaminophen (TYLENOL) 500 MG tablet Take 500 mg by mouth every 6 (six) hours as needed for pain.   Yes [provider]  amLODipine (NORVASC) 5 MG tablet Take 5 mg by mouth daily. 03/22/20  Yes [provider]  atorvastatin (LIPITOR) 10 MG tablet Take 10 mg by mouth at bedtime. 02/03/20  Yes [provider]  B Complex Vitamins (VITAMIN B-COMPLEX PO) Take 1 tablet by mouth daily.   Yes [provider]  CALCIUM PO Take 1 tablet by mouth 2 (two)  times daily.   Yes [provider]  Cholecalciferol (VITAMIN D-3 PO) Take 1 tablet by mouth daily.   Yes [provider]  CINNAMON PO Take 1 tablet by mouth 2 (two) times daily.   Yes [provider]  fish oil-omega-3 fatty acids 1000 MG capsule Take 1 g by mouth 2 (two) times daily.   Yes [provider]  fluticasone (FLONASE) 50 MCG/ACT nasal spray Place 2 sprays into both nostrils daily. 04/02/20  Yes [provider]  Multiple Vitamin (MULTIVITAMIN WITH MINERALS) TABS Take 1 tablet by mouth daily.   Yes [provider]  Probiotic Product (PROBIOTIC DAILY PO) Take 1 tablet by mouth daily.   Yes [provider]  vitamin E 400 UNIT capsule Take 400 Units by mouth daily.   Yes [provider]  Ascorbic Acid (VITAMIN C PO) Take 1 tablet by mouth daily.    [provider]  oxyCODONE-acetaminophen (PERCOCET/ROXICET) 5-325 MG per tablet Take 1-2 tablets by mouth every 6 (six) hours as needed for pain. Patient not taking: Reported on 04/23/2020 03/21/13   Dorie Rank, MD  traMADol (ULTRAM) 50 MG tablet Take 50-100 mg by mouth every 6 (six) hours as needed for pain.    [provider]  traMADol (ULTRAM) 50 MG tablet Take 1-2 tablets (50-100 mg total) by mouth every 6 (six) hours as needed for pain. 03/24/13   Gary Fleet, PA-C    Physical Exam: Vitals:   04/23/20 2200 04/23/20 2215 04/23/20 2230 04/23/20 2300  BP: (!) 175/84  (!) 176/75 (!) 166/92  Pulse: 85 83 85 77  Resp:      Temp:      TempSrc:      SpO2: 97% 100% 96% 96%  Weight:      Height:        Constitutional: NAD, calm, comfortable, well-appearing female appearing younger than her stated age laying in bed asleep Vitals:   04/23/20 2200 04/23/20 2215 04/23/20 2230 04/23/20 2300  BP: (!) 175/84  (!) 176/75 (!) 166/92  Pulse: 85 83 85 77  Resp:      Temp:      TempSrc:      SpO2: 97% 100% 96% 96%  Weight:      Height:       Eyes: PERRL,  lids and conjunctivae normal ENMT: Mucous membranes are moist. Posterior pharynx clear of any exudate or lesions.Normal dentition.  Neck: normal, supple Respiratory: clear to auscultation bilaterally , no wheezing, no crackles. Normal respiratory effort on room air. No accessory muscle use.  Cardiovascular: Regular rate and rhythm, 3 out of 6 systolic flow murmur. No extremity edema. 2+ pedal pulses.   Abdomen: no tenderness, no masses palpated.Bowel sounds positive.  Musculoskeletal: no clubbing / cyanosis. No joint deformity upper and lower extremities. Good ROM, no contractures. Normal muscle tone.  Skin: no rashes, lesions, ulcers. No induration Neurologic: CN 2-12 grossly intact. Sensation intact. Strength  5/5 in all 4.  Intact finger-to-nose. Psychiatric: Normal judgment and insight. Alert and oriented x 3. Normal mood.     Labs on Admission: I have personally reviewed following labs and imaging studies  CBC: Recent Labs  Lab 04/23/20 1948  WBC 14.8*  HGB 12.3  HCT 37.4  MCV 96.4  PLT [id number]   Basic Metabolic Panel: Recent Labs  Lab 04/23/20 1948  NA 140  K 4.0  CL 104  CO2 28  GLUCOSE 104*  BUN 17  CREATININE 0.96  CALCIUM 10.0   GFR: Estimated Creatinine Clearance: 48.1 mL/min (by C-G formula based on SCr of 0.96 mg/dL). Liver Function Tests: No results for input(s): AST, ALT, ALKPHOS, BILITOT, PROT, ALBUMIN in the last 168 hours. No results for input(s): LIPASE, AMYLASE in the last 168 hours. No results for input(s): AMMONIA in the last 168 hours. Coagulation Profile: No results for input(s): INR, PROTIME in the last 168 hours. Cardiac Enzymes: No results for input(s): CKTOTAL, CKMB, CKMBINDEX, TROPONINI in the last 168 hours. BNP (last 3 results) No results for input(s): PROBNP in the last 8760 hours. HbA1C: No results for input(s): HGBA1C in the last 72 hours. CBG: No results for input(s): GLUCAP in the last 168 hours. Lipid Profile: No results for  input(s): CHOL, HDL, LDLCALC, TRIG, CHOLHDL, LDLDIRECT in the last 72 hours. Thyroid Function Tests: No results for input(s): TSH, T4TOTAL, FREET4, T3FREE, THYROIDAB in the last 72 hours. Anemia Panel: No results for input(s): VITAMINB12, FOLATE, FERRITIN, TIBC, IRON, RETICCTPCT in the last 72 hours. Urine analysis: No results found for: COLORURINE, APPEARANCEUR, [place], [place], GLUCOSEU, [place], [place], [place], PROTEINUR, [place], NITRITE, LEUKOCYTESUR  Radiological Exams on Admission: DG Chest 2 View  Result Date: 04/23/2020 CLINICAL DATA:  [place], left arm pain EXAM: CHEST - 2 VIEW COMPARISON:  None. FINDINGS: Frontal and lateral views of the chest demonstrate an unremarkable cardiac silhouette. No airspace disease, effusion, or pneumothorax. No acute displaced fractures. IMPRESSION: 1. No acute intrathoracic process. Electronically Signed   By: Randa Ngo M.D.   On: 04/23/2020 21:45   CT Head Wo Contrast  Result Date: 04/23/2020 CLINICAL DATA:  Head trauma, headache Fall going out the door onto cement floor. No loss of consciousness. Hematoma to left side of face with laceration. EXAM: CT HEAD WITHOUT CONTRAST TECHNIQUE: Contiguous axial images were obtained from the base of the skull through the vertex without intravenous contrast. COMPARISON:  None. FINDINGS: Brain: Small focal hyperdensity in the inferior left frontal lobe, series 2, image 14 suspicious for small volume hemorrhage, subarachnoid versus intraparenchymal. No surrounding mass effect. No midline shift. No subdural hematoma. Brain volume is normal for age. 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 No hydrocephalus. Vascular: Atherosclerosis of skullbase vasculature without hyperdense vessel or abnormal calcification. Skull: No fracture or focal lesion. Sinuses/Orbits: Left supraorbital laceration. No evidence of orbital  or facial bone fracture. Mastoid air cells are clear. Other: None. IMPRESSION: 1. Small focal hyperdensity in the inferior left frontal lobe suspicious for small volume hemorrhage, subarachnoid versus intraparenchymal. No surrounding mass effect. 2. No skull fracture. 3. Left supraorbital laceration. No evidence of orbital or facial bone fracture. 4. 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 These results were called by telephone at the time of interpretation on 04/23/2020 at 8:11 pm to Dr Blake Divine , who verbally acknowledged these results. Electronically Signed   By: Keith Rake  M.D.   On: 04/23/2020 20:11   CT Cervical Spine Wo Contrast  Result Date: 04/23/2020 CLINICAL DATA:  Spine fracture, cervical, traumatic fall near syncope Fall going out door onto cement floor. Hematoma to left side of face with laceration. EXAM: CT CERVICAL SPINE WITHOUT CONTRAST TECHNIQUE: Multidetector CT imaging of the cervical spine was performed without intravenous contrast. Multiplanar CT image reconstructions were also generated. COMPARISON:  None. FINDINGS: Alignment: No traumatic subluxation. Trace anterolisthesis of C3 on C4 and C7 on T1 is likely degenerative. Skull base and vertebrae: No acute fracture. Vertebral body heights are maintained. The dens and skull base are intact. Incomplete fusion involving the left posterior arch of C1. Soft tissues and spinal canal: No prevertebral fluid or swelling. No visible canal hematoma. Disc levels: Disc space narrowing and endplate spurring at [id number], C5-C6, and C6-C7. Upper chest: No acute finding. Other: Carotid calcifications. IMPRESSION: Degenerative change in the cervical spine without acute fracture or subluxation. Electronically Signed   By: Keith Rake M.D.   On: 04/23/2020 20:14   DG Shoulder Left  Result Date: 04/23/2020 CLINICAL DATA:  [place] EXAM: LEFT SHOULDER - 2+ VIEW COMPARISON:  None. FINDINGS: Internal  rotation, external rotation, and transscapular views of the left shoulder are obtained. No acute displaced fracture. There is moderate glenohumeral osteoarthritis with joint space narrowing and osteophyte formation. Mild hypertrophic changes of the acromioclavicular joint. The left chest is clear. IMPRESSION: 1. Glenohumeral and acromioclavicular joint osteoarthritis. 2. No acute displaced fracture. Electronically Signed   By: Randa Ngo M.D.   On: 04/23/2020 21:48      Assessment/Plan Syncope/loss of consciousness Patient noted to have a new heart murmur.  Will obtain echocardiogram. Placed on continuous telemetry Obtain TSH  Suspected small volume hemorrhage of the inferior left frontal lobe Repeat CT head in 6 hours per neurosurgery Frequent neuro checks  Left temporal laceration S/p 3 sutures in the ED  Hypertension Continue amlodipine  Hyperlipidemia Continue statin  DVT prophylaxis:SCDs Code Status: Full Family Communication: Plan discussed with patient at bedside  disposition Plan: Home with observation Consults called: Neurosurgery Admission status: Observation  Status is: Observation  The patient remains OBS appropriate and will d/c before 2 midnights.  Dispo: The patient is from: Home              Anticipated d/c is to: Home              Anticipated d/c date is: 1 day              Patient currently is not medically stable to d/c.         Orene Desanctis DO [facility]   If 7PM-7AM, please contact night-coverage [email or web address]   04/24/2020, 12:30 AM

## 2020-04-24 NOTE — Consult Note (Signed)
Referring Physician:  No referring provider defined for this encounter.  Primary Physician:  Philmore Pali, NP  Chief Complaint: Fall with traumatic IPH  History of Present Illness: Leah Mitchell is a 74 y.o. female who presented to the [facility] ED last night after having a fall.  She reports that she was stepping out onto her porch she thinks to call her dog into the house however the next thing she recalls is that she was laying on the floor.  She is unsure how she fell, and she has never experienced anything like this in the past.  CT imaging revealed a small left frontal lobe hemorrhage and therefore neurosurgery was called for evaluation.  A repeat head CT was obtained which shows a stable IPH.  Ms. Leah Mitchell is not on any blood thinners or antiplatelet medications.  She denies use of any Goody powder, aspirin, ibuprofen.  She does have hypertension.     Review of Systems:  A 10 point review of systems is negative, except for the pertinent positives and negatives detailed in the HPI.  Past Medical History: Past Medical History:  Diagnosis Date  . Back pain   . Crohn disease ([place])   . GERD (gastroesophageal reflux disease)    no meds  . Wears glasses   . Wears partial dentures    upper partial    Past Surgical History: Past Surgical History:  Procedure Laterality Date  . BACK SURGERY  91,2001   lumb lam  . KNEE ARTHROSCOPY  2010   right   . KNEE ARTHROSCOPY Left 03/24/2013   Procedure: ARTHROSCOPY KNEE MEDIAL PLICA EXCISION, AND  PARTIAL MEDIAL MENISECTOMY;  Surgeon: Alta Corning, MD;  Location: [place];  Service: Orthopedics;  Laterality: Left;  . LUMBAR LAMINECTOMY  2012   micerodiskectomy  . TONSILLECTOMY    . TRIGGER FINGER RELEASE  2006   rt long  . UPPER GI ENDOSCOPY      Allergies: Allergies as of 04/23/2020 - Review Complete 04/23/2020  Allergen Reaction Noted  . Aspirin Other (See Comments) 03/21/2013  . Prednisone Rash 03/21/2013  .  Sulfa antibiotics Rash 03/21/2013    Medications:  Current Facility-Administered Medications:  .  acetaminophen (TYLENOL) tablet 500 mg, 500 mg, Oral, Q6H PRN, Tu, Ching T, DO .  amLODipine (NORVASC) tablet 5 mg, 5 mg, Oral, Daily, Tu, Ching T, DO .  atorvastatin (LIPITOR) tablet 10 mg, 10 mg, Oral, QHS, Tu, Ching T, DO .  lidocaine (LIDODERM) 5 % 1 patch, 1 patch, Transdermal, Q24H, Blake Divine, MD, 1 patch at 04/23/20 2334 .  sodium chloride flush (NS) 0.9 % injection 3 mL, 3 mL, Intravenous, Once, Blake Divine, MD .  traMADol (ULTRAM) tablet 50 mg, 50 mg, Oral, Q6H PRN, Tu, Ching T, DO  Current Outpatient Medications:  .  acetaminophen (TYLENOL) 500 MG tablet, Take 500 mg by mouth every 6 (six) hours as needed for pain., Disp: , Rfl:  .  amLODipine (NORVASC) 5 MG tablet, Take 5 mg by mouth daily., Disp: , Rfl:  .  atorvastatin (LIPITOR) 10 MG tablet, Take 10 mg by mouth at bedtime., Disp: , Rfl:  .  B Complex Vitamins (VITAMIN B-COMPLEX PO), Take 1 tablet by mouth daily., Disp: , Rfl:  .  CALCIUM PO, Take 1 tablet by mouth 2 (two) times daily., Disp: , Rfl:  .  Cholecalciferol (VITAMIN D-3 PO), Take 1 tablet by mouth daily., Disp: , Rfl:  .  CINNAMON PO, Take 1 tablet  by mouth 2 (two) times daily., Disp: , Rfl:  .  fish oil-omega-3 fatty acids 1000 MG capsule, Take 1 g by mouth 2 (two) times daily., Disp: , Rfl:  .  fluticasone (FLONASE) 50 MCG/ACT nasal spray, Place 2 sprays into both nostrils daily., Disp: , Rfl:  .  Multiple Vitamin (MULTIVITAMIN WITH MINERALS) TABS, Take 1 tablet by mouth daily., Disp: , Rfl:  .  Probiotic Product (PROBIOTIC DAILY PO), Take 1 tablet by mouth daily., Disp: , Rfl:  .  vitamin E 400 UNIT capsule, Take 400 Units by mouth daily., Disp: , Rfl:  .  Ascorbic Acid (VITAMIN C PO), Take 1 tablet by mouth daily., Disp: , Rfl:  .  oxyCODONE-acetaminophen (PERCOCET/ROXICET) 5-325 MG per tablet, Take 1-2 tablets by mouth every 6 (six) hours as needed for  pain. (Patient not taking: Reported on 04/23/2020), Disp: 8 tablet, Rfl: 0 .  traMADol (ULTRAM) 50 MG tablet, Take 50-100 mg by mouth every 6 (six) hours as needed for pain., Disp: , Rfl:  .  traMADol (ULTRAM) 50 MG tablet, Take 1-2 tablets (50-100 mg total) by mouth every 6 (six) hours as needed for pain., Disp: 30 tablet, Rfl: 0   Social History: Social History   Tobacco Use  . Smoking status: Former Smoker    Quit date: 03/23/1973    Years since quitting: 47.1  . Smokeless tobacco: Never Used  Substance Use Topics  . Alcohol use: No  . Drug use: No    Family Medical History: No family history on file.  Physical Examination: Vitals:   04/24/20 0600 04/24/20 0630  BP: (!) 174/90 (!) 168/72  Pulse: 72 76  Resp:    Temp:    SpO2: 94% 96%     General: Patient is well developed, well nourished, calm, collected, and in no apparent distress.  Psychiatric: Patient is non-anxious.  Head:  Pupils equal, round, and reactive to light.  Neck:   Supple.  Full range of motion. No TTP over cervical spine   NEUROLOGICAL:  General: In no acute distress.   Awake, alert, oriented to person, place, and time.   Pupils equal round and reactive to light.   EOMI. No facial weakness noted.  Tongue protrusion is midline.  There is no pronator drift. Speech is fluent, she is able to name, repeat, and comprehend, follows simple commands. Strength: Bilateral upper and lower extremities are 5 out of 5 strength.  All extremities equally.  No drift.  ROM of spine: Cervical: moving in all directions, full and active ROM     Imaging: 04/23/2020 CT Head:  IMPRESSION: 1. Small focal hyperdensity in the inferior left frontal lobe suspicious for small volume hemorrhage, subarachnoid versus intraparenchymal. No surrounding mass effect. 2. No skull fracture. 3. Left supraorbital laceration. No evidence of orbital or facial bone fracture. 4. Moderate to advanced chronic small vessel ischemia  with multiple remote lacunar infarcts.  Repeat CT head 04/24/2020: IMPRESSION: 1. Unchanged small left frontal lobe hemorrhage, favor intraparenchymal rather than subarachnoid. No surrounding edema. No new or progressive hemorrhage. 2. Unchanged atrophy and chronic small vessel ischemia. Remote lacunar infarcts in the bilateral basal ganglia.   CT cervical spine 04/23/2020: IMPRESSION: Degenerative change in the cervical spine without acute fracture or subluxation.    Assessment and Plan: Ms. Kenney is a pleasant 74 y.o. female with a very small IPH after suffering a fall.  Reassuringly, her neuro exam is intact, no evidence of focal neurological deficit.  Repeat CT head scan  remained stable.  CT of cervical spine is negative for fracture, and she does not have pain to palpation over the cervical spine.  We discussed with her the need to avoid NSAIDs, aspirin, Goody powder, and the need to continue on her blood pressure medications to remain normotensive, avoid hypertension.  I discussed with her any symptoms that would require further intervention such as headaches, nausea, dizziness, difficulty with balance, extremity weakness, facial droop.  She verbalized understanding of all.  Given that her repeat head CT is stable, and she is without a cervical spine fracture, is no acute neurosurgical intervention required at this time.  We will arrange a follow-up in 4 weeks with her at our clinic with a repeat head CT.  From a neurosurgical standpoint, she is acceptable for discharge from the ED.  Feel free to contact us with any further questions.   Lonell Face, NP Dept. of Neurosurgery

## 2020-04-24 NOTE — Discharge Summary (Signed)
Physician Discharge Summary  Leah Mitchell [id number] DOB: [birth year] DOA: 04/23/2020  PCP: Philmore Pali, NP  Admit date: 04/23/2020 Discharge date: 04/24/2020  Admitted From: Home Disposition: Home   Recommendations for Outpatient Follow-up:  1. Follow up with PCP in 1-2 weeks 2. [place] up with cardiology tomorrow. 3. Please obtain BMP/CBC in one week 4. Please follow up on the following pending results: Echocardiogram.  Home Health: No Equipment/Devices: None Discharge Condition: Stable CODE STATUS: Full Diet recommendation: Heart Healthy    Brief/Interim Summary: Leah Mitchell is a 74 y.o. female with medical history significant for Crohn's disease, hypertension and hyperlipidemia who presents with concerns of syncope and loss of consciousness.   Patient reports that she was opening a door to go to her porch to call her dog into the house and the next thing she recalls is her son calling her name but she laid on the floor.  Unsure for how long she had loss of consciousness.  She denies feeling dizziness, lightheadedness, chest pain or palpitations or shortness of breath prior to episode.  No nausea or vomiting.  No bowel or bladder incontinence.  States that she was otherwise in her normal state of health.  Had good appetite.  Endorsed compliant with her medication.  Has never had any episode of syncope like this.  Initial CT head with a small focal hypodensity in inferior left frontal lobe concerning for a small volume hemorrhage.  Dr. Lacinda Axon from neurosurgery was consulted and he advised repeat CT in 6 hours which remained unchanged.  They would like to have conservative management as she has a small intraparenchymal bleed and she will follow up with them as an outpatient in 1 to 2 weeks.  She also had a small laceration on her left temporal region which was sutured by ED provider.  Her EKG was without any acute changes.  Troponin remain negative.  She does not have any chest  pain.  On clinical exam she has a murmur, patient was not aware of that.  Echocardiogram was done pending results.  We made an appointment with cardiology for further work-up and a possible long-term monitor to rule out any arrhythmia.  Appointment was made with Dr. Humphrey Rolls for tomorrow.  She will continue with her home meds and follow-up with her PCP.  Discharge Diagnoses:  Principal Problem:   Syncope Active Problems:   Cerebral hemorrhage (HCC)   Facial laceration   Essential hypertension   HLD (hyperlipidemia)   Discharge Instructions  Discharge Instructions    Diet - low sodium heart healthy   Complete by: As directed    Discharge instructions   Complete by: As directed    It was pleasure taking care of you. We made an appointment to see Dr. Humphrey Rolls tomorrow at 2 PM at his office for further work-up. Please keep yourself well-hydrated and follow-up with your primary care physician and cardiologist. Your echocardiogram results are pending, your cardiologist should be able to see them.  You can discuss with him tomorrow.   Increase activity slowly   Complete by: As directed      Allergies as of 04/24/2020      Reactions   Aspirin Other (See Comments)   Causes constipation   Prednisone Rash   Sulfa Antibiotics Rash      Medication List    STOP taking these medications   oxyCODONE-acetaminophen 5-325 MG tablet Commonly known as: PERCOCET/ROXICET     TAKE these medications   acetaminophen 500 MG  tablet Commonly known as: TYLENOL Take 500 mg by mouth every 6 (six) hours as needed for pain.   amLODipine 5 MG tablet Commonly known as: NORVASC Take 5 mg by mouth daily.   atorvastatin 10 MG tablet Commonly known as: LIPITOR Take 10 mg by mouth at bedtime.   CALCIUM PO Take 1 tablet by mouth 2 (two) times daily.   CINNAMON PO Take 1 tablet by mouth 2 (two) times daily.   fish oil-omega-3 fatty acids 1000 MG capsule Take 1 g by mouth 2 (two) times daily.    fluticasone 50 MCG/ACT nasal spray Commonly known as: FLONASE Place 2 sprays into both nostrils daily.   multivitamin with minerals Tabs tablet Take 1 tablet by mouth daily.   PROBIOTIC DAILY PO Take 1 tablet by mouth daily.   traMADol 50 MG tablet Commonly known as: ULTRAM Take 50-100 mg by mouth every 6 (six) hours as needed for pain. What changed: Another medication with the same name was removed. Continue taking this medication, and follow the directions you see here.   VITAMIN B-COMPLEX PO Take 1 tablet by mouth daily.   VITAMIN C PO Take 1 tablet by mouth daily.   VITAMIN D-3 PO Take 1 tablet by mouth daily.   vitamin E 180 MG (400 UNITS) capsule Take 400 Units by mouth daily.      Follow-up Information    Philmore Pali, NP. Schedule an appointment as soon as possible for a visit.   Specialty: Nurse Practitioner Contact information: [place] [address] [phone]        Dionisio David, MD. Daphane Shepherd on 04/25/2020.   Specialty: Cardiology Why: At 2:00 PM Contact information: [place] [address] [phone]          Allergies  Allergen Reactions  . Aspirin Other (See Comments)    Causes constipation   . Prednisone Rash  . Sulfa Antibiotics Rash    Consultations:  Neurosurgery  Cardiology  Procedures/Studies: DG Chest 2 View  Result Date: 04/23/2020 CLINICAL DATA:  [place], left arm pain EXAM: CHEST - 2 VIEW COMPARISON:  None. FINDINGS: Frontal and lateral views of the chest demonstrate an unremarkable cardiac silhouette. No airspace disease, effusion, or pneumothorax. No acute displaced fractures. IMPRESSION: 1. No acute intrathoracic process. Electronically Signed   By: Randa Ngo M.D.   On: 04/23/2020 21:45   CT Head Wo Contrast  Result Date: 04/24/2020 CLINICAL DATA:  Follow-up intracranial hemorrhage. EXAM: CT HEAD WITHOUT CONTRAST TECHNIQUE: Contiguous axial images were obtained from the base of the  skull through the vertex without intravenous contrast. COMPARISON:  6 hours prior. FINDINGS: Brain: Small hemorrhage in the left frontal lobe is unchanged from prior exam, series 2, image 11, currently appears intraparenchymal rather than subarachnoid. No surrounding edema. No new or progressive hemorrhage. No midline shift. Exam is otherwise unchanged with atrophy and chronic small vessel ischemia. Remote lacunar infarcts in the bilateral basal ganglia. 5 no subdural collection. Vascular: Atherosclerosis of skullbase vasculature without hyperdense vessel or abnormal calcification. Skull: No fracture or focal lesion. Sinuses/Orbits: Left frontal supraorbital scalp laceration again seen. Other: None. IMPRESSION: 1. Unchanged small left frontal lobe hemorrhage, favor intraparenchymal rather than subarachnoid. No surrounding edema. No new or progressive hemorrhage. 2. Unchanged atrophy and chronic small vessel ischemia. Remote lacunar infarcts in the bilateral basal ganglia. Electronically Signed   By: Keith Rake M.D.   On: 04/24/2020 02:25   CT Head Wo Contrast  Result Date:  04/23/2020 CLINICAL DATA:  Head trauma, headache Fall going out the door onto cement floor. No loss of consciousness. Hematoma to left side of face with laceration. EXAM: CT HEAD WITHOUT CONTRAST TECHNIQUE: Contiguous axial images were obtained from the base of the skull through the vertex without intravenous contrast. COMPARISON:  None. FINDINGS: Brain: Small focal hyperdensity in the inferior left frontal lobe, series 2, image 14 suspicious for small volume hemorrhage, subarachnoid versus intraparenchymal. No surrounding mass effect. No midline shift. No subdural hematoma. Brain volume is normal for age. 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 No hydrocephalus. Vascular: Atherosclerosis of skullbase vasculature without hyperdense vessel  or abnormal calcification. Skull: No fracture or focal lesion. Sinuses/Orbits: Left supraorbital laceration. No evidence of orbital or facial bone fracture. Mastoid air cells are clear. Other: None. IMPRESSION: 1. Small focal hyperdensity in the inferior left frontal lobe suspicious for small volume hemorrhage, subarachnoid versus intraparenchymal. No surrounding mass effect. 2. No skull fracture. 3. Left supraorbital laceration. No evidence of orbital or facial bone fracture. 4. 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 These results were called by telephone at the time of interpretation on 04/23/2020 at 8:11 pm to Dr Blake Divine , who verbally acknowledged these results. Electronically Signed   By: Keith Rake M.D.   On: 04/23/2020 20:11   CT Cervical Spine Wo Contrast  Result Date: 04/23/2020 CLINICAL DATA:  Spine fracture, cervical, traumatic fall near syncope Fall going out door onto cement floor. Hematoma to left side of face with laceration. EXAM: CT CERVICAL SPINE WITHOUT CONTRAST TECHNIQUE: Multidetector CT imaging of the cervical spine was performed without intravenous contrast. Multiplanar CT image reconstructions were also generated. COMPARISON:  None. FINDINGS: Alignment: No traumatic subluxation. Trace anterolisthesis of C3 on C4 and C7 on T1 is likely degenerative. Skull base and vertebrae: No acute fracture. Vertebral body heights are maintained. The dens and skull base are intact. Incomplete fusion involving the left posterior arch of C1. Soft tissues and spinal canal: No prevertebral fluid or swelling. No visible canal hematoma. Disc levels: Disc space narrowing and endplate spurring at [id number], C5-C6, and C6-C7. Upper chest: No acute finding. Other: Carotid calcifications. IMPRESSION: Degenerative change in the cervical spine without acute fracture or subluxation. Electronically Signed   By: Keith Rake M.D.   On: 04/23/2020 20:14    DG Shoulder Left  Result Date: 04/23/2020 CLINICAL DATA:  [place] EXAM: LEFT SHOULDER - 2+ VIEW COMPARISON:  None. FINDINGS: Internal rotation, external rotation, and transscapular views of the left shoulder are obtained. No acute displaced fracture. There is moderate glenohumeral osteoarthritis with joint space narrowing and osteophyte formation. Mild hypertrophic changes of the acromioclavicular joint. The left chest is clear. IMPRESSION: 1. Glenohumeral and acromioclavicular joint osteoarthritis. 2. No acute displaced fracture. Electronically Signed   By: Randa Ngo M.D.   On: 04/23/2020 21:48     Subjective: Patient was feeling better when seen today.  No new complaints.  She wants to go home.  We discussed regarding following up with cardiology and she agrees.  She was accompanied with her husband in the room  Discharge Exam: Vitals:   04/24/20 0630 04/24/20 0730  BP: (!) 168/72 (!) 152/75  Pulse: 76 74  Resp:  18  Temp:    SpO2: 96% 95%   Vitals:   04/24/20 0530 04/24/20 0600 04/24/20 0630 04/24/20 0730  BP: (!) 142/71 (!) 174/90 Marland Kitchen)  168/72 (!) 152/75  Pulse: 63 72 76 74  Resp:    18  Temp:      TempSrc:      SpO2: 92% 94% 96% 95%  Weight:      Height:        General: Pt is alert, awake, not in acute distress Cardiovascular: RRR, S1/S2 +, murmur, no rubs, no gallops Respiratory: CTA bilaterally, no wheezing, no rhonchi Abdominal: Soft, NT, ND, bowel sounds + Extremities: no edema, no cyanosis   The results of significant diagnostics from this hospitalization (including imaging, microbiology, ancillary and laboratory) are listed below for reference.    Microbiology: Recent Results (from the past 240 hour(s))  SARS Coronavirus 2 by RT PCR (hospital order, performed in [facility] hospital lab) Nasopharyngeal Nasopharyngeal Swab     Status: None   Collection Time: 04/23/20 10:24 PM   Specimen: Nasopharyngeal Swab  Result Value Ref Range Status   SARS Coronavirus 2  NEGATIVE NEGATIVE Final    Comment: (NOTE) SARS-CoV-2 target nucleic acids are NOT DETECTED. The SARS-CoV-2 RNA is generally detectable in upper and lower respiratory specimens during the acute phase of infection. The lowest concentration of SARS-CoV-2 viral copies this assay can detect is 250 copies / mL. A negative result does not preclude SARS-CoV-2 infection and should not be used as the sole basis for treatment or other patient management decisions.  A negative result may occur with improper specimen collection / handling, submission of specimen other than nasopharyngeal swab, presence of viral mutation(s) within the areas targeted by this assay, and inadequate number of viral copies (<250 copies / mL). A negative result must be combined with clinical observations, patient history, and epidemiological information. Fact Sheet for Patients:   StrictlyIdeas.no Fact Sheet for Healthcare Providers: BankingDealers.co.za This test is not yet approved or cleared  by the [place] FDA and has been authorized for detection and/or diagnosis of SARS-CoV-2 by FDA under an Emergency Use Authorization (EUA).  This EUA will remain in effect (meaning this test can be used) for the duration of the COVID-19 declaration under Section 564(b)(1) of the Act, 21 U.S.C. section 360bbb-3(b)(1), unless the authorization is terminated or revoked sooner. Performed at [facility], [address] [place], [place] [address]      Labs: BNP (last 3 results) No results for input(s): BNP in the last 8760 hours. Basic Metabolic Panel: Recent Labs  Lab 04/23/20 1948 04/24/20 0432  NA 140 140  K 4.0 3.8  CL 104 108  CO2 28 24  GLUCOSE 104* 91  BUN 17 14  CREATININE 0.96 0.74  CALCIUM 10.0 9.3   Liver Function Tests: No results for input(s): AST, ALT, ALKPHOS, BILITOT, PROT, ALBUMIN in the last 168 hours. No results for input(s): LIPASE,  AMYLASE in the last 168 hours. No results for input(s): AMMONIA in the last 168 hours. CBC: Recent Labs  Lab 04/23/20 1948 04/24/20 0432  WBC 14.8* 12.2*  HGB 12.3 11.8*  HCT 37.4 36.0  MCV 96.4 95.2  PLT 357 323   Cardiac Enzymes: No results for input(s): CKTOTAL, CKMB, CKMBINDEX, TROPONINI in the last 168 hours. BNP: Invalid input(s): POCBNP CBG: No results for input(s): GLUCAP in the last 168 hours. D-Dimer No results for input(s): DDIMER in the last 72 hours. Hgb A1c No results for input(s): HGBA1C in the last 72 hours. Lipid Profile No results for input(s): CHOL, HDL, LDLCALC, TRIG, CHOLHDL, LDLDIRECT in the last 72 hours. Thyroid function studies Recent Labs  04/24/20 0432  TSH 1.028   Anemia work up No results for input(s): VITAMINB12, FOLATE, FERRITIN, TIBC, IRON, RETICCTPCT in the last 72 hours. Urinalysis No results found for: COLORURINE, APPEARANCEUR, [place], [place], [place], [place], [place], [employer], PROTEINUR, UROBILINOGEN, NITRITE, LEUKOCYTESUR Sepsis Labs Invalid input(s): PROCALCITONIN,  WBC,  LACTICIDVEN Microbiology Recent Results (from the past 240 hour(s))  SARS Coronavirus 2 by RT PCR (hospital order, performed in [facility] hospital lab) Nasopharyngeal Nasopharyngeal Swab     Status: None   Collection Time: 04/23/20 10:24 PM   Specimen: Nasopharyngeal Swab  Result Value Ref Range Status   SARS Coronavirus 2 NEGATIVE NEGATIVE Final    Comment: (NOTE) SARS-CoV-2 target nucleic acids are NOT DETECTED. The SARS-CoV-2 RNA is generally detectable in upper and lower respiratory specimens during the acute phase of infection. The lowest concentration of SARS-CoV-2 viral copies this assay can detect is 250 copies / mL. A negative result does not preclude SARS-CoV-2 infection and should not be used as the sole basis for treatment or other patient management decisions.  A negative result may occur with improper specimen collection / handling,  submission of specimen other than nasopharyngeal swab, presence of viral mutation(s) within the areas targeted by this assay, and inadequate number of viral copies (<250 copies / mL). A negative result must be combined with clinical observations, patient history, and epidemiological information. Fact Sheet for Patients:   StrictlyIdeas.no Fact Sheet for Healthcare Providers: BankingDealers.co.za This test is not yet approved or cleared  by the [place] FDA and has been authorized for detection and/or diagnosis of SARS-CoV-2 by FDA under an Emergency Use Authorization (EUA).  This EUA will remain in effect (meaning this test can be used) for the duration of the COVID-19 declaration under Section 564(b)(1) of the Act, 21 U.S.C. section 360bbb-3(b)(1), unless the authorization is terminated or revoked sooner. Performed at [facility], [address] [place], [place] [address]     Time coordinating discharge: Over 30 minutes  SIGNED:  Lorella Nimrod, MD  [facility] 04/24/2020, 1:41 PM  If 7PM-7AM, please contact night-coverage [email or web address]  This record has been created using Systems analyst. Errors have been sought and corrected,but may not always be located. Such creation errors do not reflect on the standard of care.

## 2020-04-25 ENCOUNTER — Other Ambulatory Visit: Payer: Self-pay | Admitting: Nurse Practitioner

## 2020-04-25 ENCOUNTER — Telehealth: Payer: Self-pay | Admitting: Nurse Practitioner

## 2020-04-25 DIAGNOSIS — I10 Essential (primary) hypertension: Secondary | ICD-10-CM | POA: Diagnosis not present

## 2020-04-25 DIAGNOSIS — E782 Mixed hyperlipidemia: Secondary | ICD-10-CM | POA: Diagnosis not present

## 2020-04-25 DIAGNOSIS — R55 Syncope and collapse: Secondary | ICD-10-CM | POA: Diagnosis not present

## 2020-04-25 DIAGNOSIS — I619 Nontraumatic intracerebral hemorrhage, unspecified: Secondary | ICD-10-CM

## 2020-04-25 NOTE — Telephone Encounter (Signed)
04/25/20~LM on hm & mobile VM. Schd few days prior to 05/25/20. MF

## 2020-04-30 DIAGNOSIS — R55 Syncope and collapse: Secondary | ICD-10-CM | POA: Diagnosis not present

## 2020-04-30 DIAGNOSIS — Z8679 Personal history of other diseases of the circulatory system: Secondary | ICD-10-CM | POA: Diagnosis not present

## 2020-04-30 DIAGNOSIS — S0181XD Laceration without foreign body of other part of head, subsequent encounter: Secondary | ICD-10-CM | POA: Diagnosis not present

## 2020-04-30 DIAGNOSIS — I10 Essential (primary) hypertension: Secondary | ICD-10-CM | POA: Diagnosis not present

## 2020-04-30 DIAGNOSIS — Z79899 Other long term (current) drug therapy: Secondary | ICD-10-CM | POA: Diagnosis not present

## 2020-05-01 DIAGNOSIS — R55 Syncope and collapse: Secondary | ICD-10-CM | POA: Diagnosis not present

## 2020-05-02 DIAGNOSIS — R55 Syncope and collapse: Secondary | ICD-10-CM | POA: Diagnosis not present

## 2020-05-08 DIAGNOSIS — R55 Syncope and collapse: Secondary | ICD-10-CM | POA: Diagnosis not present

## 2020-05-08 DIAGNOSIS — E782 Mixed hyperlipidemia: Secondary | ICD-10-CM | POA: Diagnosis not present

## 2020-05-08 DIAGNOSIS — I10 Essential (primary) hypertension: Secondary | ICD-10-CM | POA: Diagnosis not present

## 2020-05-24 ENCOUNTER — Ambulatory Visit
Admission: RE | Admit: 2020-05-24 | Discharge: 2020-05-24 | Disposition: A | Discharge status: Home / Self Care | Payer: Medicare Other | Source: Ambulatory Visit | Attending: Nurse Practitioner | Admitting: Nurse Practitioner

## 2020-05-24 ENCOUNTER — Other Ambulatory Visit: Payer: Self-pay

## 2020-05-24 DIAGNOSIS — I619 Nontraumatic intracerebral hemorrhage, unspecified: Secondary | ICD-10-CM

## 2020-07-12 DIAGNOSIS — H2513 Age-related nuclear cataract, bilateral: Secondary | ICD-10-CM | POA: Diagnosis not present

## 2020-08-30 DIAGNOSIS — I10 Essential (primary) hypertension: Secondary | ICD-10-CM | POA: Diagnosis not present

## 2020-08-30 DIAGNOSIS — H2511 Age-related nuclear cataract, right eye: Secondary | ICD-10-CM | POA: Diagnosis not present

## 2020-09-03 DIAGNOSIS — Z23 Encounter for immunization: Secondary | ICD-10-CM | POA: Diagnosis not present

## 2020-09-10 ENCOUNTER — Encounter: Payer: Self-pay | Admitting: Ophthalmology

## 2020-09-10 ENCOUNTER — Other Ambulatory Visit: Payer: Self-pay

## 2020-09-10 NOTE — Anesthesia Preprocedure Evaluation (Addendum)
Anesthesia Evaluation  Patient identified by MRN, date of birth, ID band Patient awake    Reviewed: Allergy & Precautions, H&P , NPO status , Patient's Chart, lab work & pertinent test results  Airway Mallampati: II  TM Distance: >3 FB Neck ROM: full    Dental no notable dental hx. (+) Partial Upper   Pulmonary former smoker,   H/o cerebral hemorrhage   Pulmonary exam normal breath sounds clear to auscultation       Cardiovascular hypertension, Normal cardiovascular exam Rhythm:regular Rate:Normal   HLD   Neuro/Psych    GI/Hepatic GERD  , Chron's   Endo/Other    Renal/GU      Musculoskeletal   Abdominal   Peds  Hematology   Anesthesia Other Findings   Reproductive/Obstetrics                            Anesthesia Physical Anesthesia Plan  ASA: II  Anesthesia Plan: MAC   Post-op Pain Management:    Induction: Intravenous  PONV Risk Score and Plan: 2 and TIVA, Midazolam and Treatment may vary due to age or medical condition  Airway Management Planned: Nasal Cannula  Additional Equipment:   Intra-op Plan:   Post-operative Plan:   Informed Consent: I have reviewed the patients History and Physical, chart, labs and discussed the procedure including the risks, benefits and alternatives for the proposed anesthesia with the patient or authorized representative who has indicated his/her understanding and acceptance.     Dental Advisory Given  Plan Discussed with: CRNA and Anesthesiologist  Anesthesia Plan Comments:        Anesthesia Quick Evaluation

## 2020-09-12 ENCOUNTER — Other Ambulatory Visit
Admission: RE | Admit: 2020-09-12 | Discharge: 2020-09-12 | Disposition: A | Discharge status: Home / Self Care | Payer: Medicare Other | Source: Ambulatory Visit | Attending: Ophthalmology | Admitting: Ophthalmology

## 2020-09-12 ENCOUNTER — Other Ambulatory Visit: Payer: Self-pay

## 2020-09-12 DIAGNOSIS — Z01812 Encounter for preprocedural laboratory examination: Secondary | ICD-10-CM | POA: Diagnosis not present

## 2020-09-12 DIAGNOSIS — Z20822 Contact with and (suspected) exposure to covid-19: Secondary | ICD-10-CM | POA: Insufficient documentation

## 2020-09-12 LAB — SARS CORONAVIRUS 2 (TAT 6-24 HRS): SARS Coronavirus 2: NEGATIVE

## 2020-09-12 NOTE — Discharge Instructions (Signed)

## 2020-09-16 ENCOUNTER — Ambulatory Visit (INDEPENDENT_AMBULATORY_CARE_PROVIDER_SITE_OTHER): Payer: Medicare Other

## 2020-09-16 ENCOUNTER — Ambulatory Visit
Admission: EM | Admit: 2020-09-16 | Discharge: 2020-09-16 | Disposition: A | Discharge status: Home / Self Care | Payer: Medicare Other | Attending: Family Medicine | Admitting: Family Medicine

## 2020-09-16 ENCOUNTER — Other Ambulatory Visit: Payer: Self-pay

## 2020-09-16 DIAGNOSIS — I6389 Other cerebral infarction: Secondary | ICD-10-CM | POA: Diagnosis not present

## 2020-09-16 DIAGNOSIS — G319 Degenerative disease of nervous system, unspecified: Secondary | ICD-10-CM | POA: Diagnosis not present

## 2020-09-16 DIAGNOSIS — S0990XA Unspecified injury of head, initial encounter: Secondary | ICD-10-CM

## 2020-09-16 DIAGNOSIS — S0003XA Contusion of scalp, initial encounter: Secondary | ICD-10-CM | POA: Diagnosis not present

## 2020-09-16 DIAGNOSIS — I6782 Cerebral ischemia: Secondary | ICD-10-CM | POA: Diagnosis not present

## 2020-09-16 NOTE — ED Triage Notes (Signed)
Patient states that she was coming in to the Surgery center today for cataract surgery. Patient states that she tripped in the parking lot and hit her head on the curb.

## 2020-09-16 NOTE — ED Provider Notes (Signed)
[facility]    CSN: [id number] Arrival date & time: 09/16/20  1053      History   Chief Complaint Chief Complaint  Patient presents with  . Fall   HPI  74 year old female presents for evaluation the above.  Patient was in our parking lot headed to [place] for cataract surgery.  She tripped in the parking lot and fell.  She hit her head.  She has a left frontal hematoma.  She has a small abrasion to the left side of the nose and also has a slight skin tear to the left cheek.  Bleeding well controlled at this time.  She was brought here for further valuation.  Reports mild pain, 4/10 in severity.  No weakness.  No difficulty with speech.  No other associated symptoms.  No other complaints.  Past Medical History:  Diagnosis Date  . Back pain   . Crohn disease ([place])   . GERD (gastroesophageal reflux disease)    no meds  . Hypertension   . Wears glasses   . Wears partial dentures    upper partial    Patient Active Problem List   Diagnosis Date Noted  . Syncope 04/24/2020  . Cerebral hemorrhage ([place]) 04/24/2020  . Facial laceration 04/24/2020  . Essential hypertension 04/24/2020  . HLD (hyperlipidemia) 04/24/2020  . Fall     Past Surgical History:  Procedure Laterality Date  . BACK SURGERY  91,2001   lumb lam  . KNEE ARTHROSCOPY  2010   right   . KNEE ARTHROSCOPY Left 03/24/2013   Procedure: ARTHROSCOPY KNEE MEDIAL PLICA EXCISION, AND  PARTIAL MEDIAL MENISECTOMY;  Surgeon: Alta Corning, MD;  Location: [facility];  Service: Orthopedics;  Laterality: Left;  . LUMBAR LAMINECTOMY  2012   micerodiskectomy  . TONSILLECTOMY    . TRIGGER FINGER RELEASE  2006   rt long  . UPPER GI ENDOSCOPY      OB History   No obstetric history on file.      Home Medications    Prior to Admission medications   Medication Sig Start Date End Date Taking? Authorizing Provider  acetaminophen (TYLENOL) 500 MG tablet Take 500 mg by mouth every 6  (six) hours as needed for pain.   Yes [provider]  amLODipine (NORVASC) 5 MG tablet Take 5 mg by mouth daily. 03/22/20  Yes [provider]  ASTAXANTHIN PO Take by mouth.   Yes [provider]  atorvastatin (LIPITOR) 10 MG tablet Take 10 mg by mouth at bedtime. 02/03/20  Yes [provider]  B Complex Vitamins (VITAMIN B-COMPLEX PO) Take 1 tablet by mouth daily.   Yes [provider]  Cholecalciferol (VITAMIN D-3 PO) Take 1 tablet by mouth daily.   Yes [provider]  CINNAMON PO Take 1 tablet by mouth 2 (two) times daily.   Yes [provider]  Coenzyme Q10 (CO Q10 PO) Take by mouth daily.   Yes [provider]  fish oil-omega-3 fatty acids 1000 MG capsule Take 1 g by mouth 2 (two) times daily.   Yes [provider]  fluticasone (FLONASE) 50 MCG/ACT nasal spray Place 2 sprays into both nostrils daily. 04/02/20  Yes [provider]  LECITHIN PO Take by mouth.   Yes [provider]  Menaquinone-7 (VITAMIN K2 PO) Take by mouth daily.   Yes [provider]  MILK THISTLE PO Take by mouth daily.   Yes [provider]  Multiple Vitamin (  MULTIVITAMIN WITH MINERALS) TABS Take 1 tablet by mouth daily. Ultimate 10 + probiotics   Yes [provider]  vitamin E 400 UNIT capsule Take 400 Units by mouth daily.   Yes [provider]   Social History Social History   Tobacco Use  . Smoking status: Former Smoker    Quit date: 03/23/1973    Years since quitting: 47.5  . Smokeless tobacco: Never Used  Vaping Use  . Vaping Use: Never used  Substance Use Topics  . Alcohol use: No  . Drug use: No     Allergies   Aspirin, Prednisone, and Sulfa antibiotics   Review of Systems Review of Systems  Skin: Positive for wound.  Neurological: Positive for headaches. Negative for weakness.   Physical Exam Triage Vital Signs ED Triage Vitals  Enc Vitals Group     BP  09/16/20 1114 (!) 164/69     Pulse Rate 09/16/20 1114 71     Resp 09/16/20 1114 16     Temp 09/16/20 1114 98.2 F (36.8 C)     Temp Source 09/16/20 1114 Oral     SpO2 09/16/20 1114 99 %     Weight 09/16/20 1103 154 lb 15.7 oz (70.3 kg)     Height 09/16/20 1103 5\' 6"  (1.676 m)     Head Circumference --      Peak Flow --      Pain Score 09/16/20 1102 4     Pain Loc --      Pain Edu? --      Excl. in [place]? --    Updated Vital Signs BP (!) 164/69 (BP Location: Left Arm)   Pulse 71   Temp 98.2 F (36.8 C) (Oral)   Resp 16   Ht 5\' 6"  (1.676 m)   Wt 70.3 kg   SpO2 99%   BMI 25.01 kg/m   Visual Acuity Right Eye Distance:   Left Eye Distance:   Bilateral Distance:    Right Eye Near:   Left Eye Near:    Bilateral Near:     Physical Exam Vitals and nursing note reviewed.  Constitutional:      General: She is not in acute distress.    Appearance: She is not ill-appearing.  HENT:     Head:     Comments: Left frontal hematoma.  Small superficial skin tear to the left maxilla.      Nose:     Comments: Small abrasion to the left side of the nose near the nasal bridge. Eyes:     General:        Right eye: No discharge.        Left eye: No discharge.     Pupils: Pupils are equal, round, and reactive to light.  Cardiovascular:     Rate and Rhythm: Normal rate and regular rhythm.     Heart sounds: Murmur heard.   Pulmonary:     Effort: Pulmonary effort is normal.     Breath sounds: Normal breath sounds. No wheezing, rhonchi or rales.  Neurological:     Mental Status: She is alert.  Psychiatric:        Mood and Affect: Mood normal.        Behavior: Behavior normal.    UC Treatments / Results  Labs (all labs ordered are listed, but only abnormal results are displayed) Labs Reviewed - No data to display  EKG   Radiology CT Head Wo Contrast  Result Date: 09/16/2020 CLINICAL  DATA:  Fall, head injury. EXAM: CT HEAD WITHOUT CONTRAST TECHNIQUE: Contiguous axial  images were obtained from the base of the skull through the vertex without intravenous contrast. COMPARISON:  CT head 05/24/2020 FINDINGS: Brain: Mild atrophy without hydrocephalus. Chronic microvascular ischemic changes in the white matter. 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 Negative for acute infarct, hemorrhage, mass. Vascular: Negative for hyperdense vessel Skull: Negative for skull fracture.  Left frontal scalp hematoma Sinuses/Orbits: Paranasal sinuses clear.  Negative orbit Other: None IMPRESSION: No acute intracranial injury Atrophy with chronic  ischemic change. Electronically Signed   By: Franchot Gallo M.D.   On: 09/16/2020 11:38    Procedures Procedures (including critical care time)  Medications Ordered in UC Medications - No data to display  Initial Impression / Assessment and Plan / UC Course  I have reviewed the triage vital signs and the nursing notes.  Pertinent labs & imaging results that were available during my care of the patient were reviewed by me and considered in my medical decision making (see chart for details).    74 year old female presents with minor head injury.  CT obtained and was negative for acute intracranial abnormality.  Left frontal hematoma.  Advised ice.  Tylenol as needed.  Final Clinical Impressions(s) / UC Diagnoses   Final diagnoses:  Hematoma of frontal scalp, initial encounter  Minor head injury, initial encounter     Discharge Instructions     Rest.  Ice several times today.   Tylenol 1000 mg three times daily as needed for pain.  CT looked good.  Take care  Dr. Lacinda Axon    ED Prescriptions    None     PDMP not reviewed this encounter.   Coral Spikes, [place] 09/16/20 1544

## 2020-09-16 NOTE — Discharge Instructions (Signed)
Rest.  Ice several times today.   Tylenol 1000 mg three times daily as needed for pain.  CT looked good.  Take care  Dr. Lacinda Axon

## 2020-09-24 DIAGNOSIS — E7800 Pure hypercholesterolemia, unspecified: Secondary | ICD-10-CM | POA: Diagnosis not present

## 2020-10-01 ENCOUNTER — Encounter: Payer: Self-pay | Admitting: Ophthalmology

## 2020-10-01 ENCOUNTER — Other Ambulatory Visit: Payer: Self-pay

## 2020-10-03 ENCOUNTER — Other Ambulatory Visit: Payer: Self-pay

## 2020-10-03 ENCOUNTER — Other Ambulatory Visit
Admission: RE | Admit: 2020-10-03 | Discharge: 2020-10-03 | Disposition: A | Discharge status: Home / Self Care | Payer: Medicare Other | Source: Ambulatory Visit | Attending: Ophthalmology | Admitting: Ophthalmology

## 2020-10-03 DIAGNOSIS — Z01812 Encounter for preprocedural laboratory examination: Secondary | ICD-10-CM | POA: Insufficient documentation

## 2020-10-03 DIAGNOSIS — Z9181 History of falling: Secondary | ICD-10-CM | POA: Diagnosis not present

## 2020-10-03 DIAGNOSIS — K5090 Crohn's disease, unspecified, without complications: Secondary | ICD-10-CM | POA: Diagnosis not present

## 2020-10-03 DIAGNOSIS — Z20822 Contact with and (suspected) exposure to covid-19: Secondary | ICD-10-CM | POA: Diagnosis not present

## 2020-10-03 DIAGNOSIS — R351 Nocturia: Secondary | ICD-10-CM | POA: Diagnosis not present

## 2020-10-03 DIAGNOSIS — Z6824 Body mass index (BMI) 24.0-24.9, adult: Secondary | ICD-10-CM | POA: Diagnosis not present

## 2020-10-03 DIAGNOSIS — R011 Cardiac murmur, unspecified: Secondary | ICD-10-CM | POA: Diagnosis not present

## 2020-10-03 DIAGNOSIS — J329 Chronic sinusitis, unspecified: Secondary | ICD-10-CM | POA: Diagnosis not present

## 2020-10-03 DIAGNOSIS — E7800 Pure hypercholesterolemia, unspecified: Secondary | ICD-10-CM | POA: Diagnosis not present

## 2020-10-03 DIAGNOSIS — H18519 Endothelial corneal dystrophy, unspecified eye: Secondary | ICD-10-CM | POA: Diagnosis not present

## 2020-10-03 DIAGNOSIS — I10 Essential (primary) hypertension: Secondary | ICD-10-CM | POA: Diagnosis not present

## 2020-10-03 LAB — SARS CORONAVIRUS 2 (TAT 6-24 HRS): SARS Coronavirus 2: NEGATIVE

## 2020-10-07 ENCOUNTER — Ambulatory Visit
Admission: RE | Admit: 2020-10-07 | Discharge: 2020-10-07 | Disposition: A | Discharge status: Home / Self Care | Payer: Medicare Other | Attending: Ophthalmology | Admitting: Ophthalmology

## 2020-10-07 ENCOUNTER — Encounter: Payer: Self-pay | Admitting: Anesthesiology

## 2020-10-07 ENCOUNTER — Encounter: Payer: Self-pay | Admitting: Ophthalmology

## 2020-10-07 ENCOUNTER — Encounter
Admission: RE | Disposition: A | Discharge status: Home / Self Care | Payer: Self-pay | Source: Home / Self Care | Attending: Ophthalmology

## 2020-10-07 ENCOUNTER — Other Ambulatory Visit: Payer: Self-pay

## 2020-10-07 DIAGNOSIS — H2511 Age-related nuclear cataract, right eye: Secondary | ICD-10-CM | POA: Insufficient documentation

## 2020-10-07 DIAGNOSIS — Z87891 Personal history of nicotine dependence: Secondary | ICD-10-CM | POA: Insufficient documentation

## 2020-10-07 DIAGNOSIS — H52229 Regular astigmatism, unspecified eye: Secondary | ICD-10-CM | POA: Insufficient documentation

## 2020-10-07 DIAGNOSIS — H25811 Combined forms of age-related cataract, right eye: Secondary | ICD-10-CM | POA: Diagnosis not present

## 2020-10-07 DIAGNOSIS — Z79899 Other long term (current) drug therapy: Secondary | ICD-10-CM | POA: Insufficient documentation

## 2020-10-07 DIAGNOSIS — H18519 Endothelial corneal dystrophy, unspecified eye: Secondary | ICD-10-CM | POA: Insufficient documentation

## 2020-10-07 HISTORY — DX: Essential (primary) hypertension: I10

## 2020-10-07 HISTORY — PX: CATARACT EXTRACTION W/PHACO: SHX586

## 2020-10-07 SURGERY — PHACOEMULSIFICATION, CATARACT, WITH IOL INSERTION
Anesthesia: Monitor Anesthesia Care | Site: Eye | Laterality: Right

## 2020-10-07 MED ORDER — ONDANSETRON HCL 4 MG/2ML IJ SOLN: 4.0000 mg | Freq: Once | INTRAMUSCULAR | Status: DC | PRN

## 2020-10-07 MED ORDER — TETRACAINE HCL 0.5 % OP SOLN: 1.0000 [drp] | OPHTHALMIC | Status: DC | PRN

## 2020-10-07 MED ORDER — ARMC OPHTHALMIC DILATING DROPS
1.0000 "application " | OPHTHALMIC | Status: DC | PRN
  Administered 2020-10-07 (×3): 1 via OPHTHALMIC

## 2020-10-07 MED ORDER — SODIUM HYALURONATE 10 MG/ML IO SOLN
INTRAOCULAR | Status: DC | PRN
  Administered 2020-10-07: 0.55 mL via INTRAOCULAR

## 2020-10-07 MED ORDER — ARMC OPHTHALMIC DILATING DROPS: 1.0000 "application " | OPHTHALMIC | Status: DC | PRN

## 2020-10-07 MED ORDER — ACETAMINOPHEN 325 MG PO TABS: 325.0000 mg | ORAL_TABLET | Freq: Once | ORAL | Status: DC

## 2020-10-07 MED ORDER — LACTATED RINGERS IV SOLN: INTRAVENOUS | Status: DC

## 2020-10-07 MED ORDER — MIDAZOLAM HCL 2 MG/2ML IJ SOLN
INTRAMUSCULAR | Status: DC | PRN
  Administered 2020-10-07: .5 mg via INTRAVENOUS
  Administered 2020-10-07: 1 mg via INTRAVENOUS
  Administered 2020-10-07: .5 mg via INTRAVENOUS

## 2020-10-07 MED ORDER — ACETAMINOPHEN 160 MG/5ML PO SOLN: 325.0000 mg | Freq: Once | ORAL | Status: DC

## 2020-10-07 MED ORDER — EPINEPHRINE PF 1 MG/ML IJ SOLN
INTRAOCULAR | Status: DC | PRN
  Administered 2020-10-07: 69 mL via OPHTHALMIC

## 2020-10-07 MED ORDER — FENTANYL CITRATE (PF) 100 MCG/2ML IJ SOLN
INTRAMUSCULAR | Status: DC | PRN
  Administered 2020-10-07: 50 ug via INTRAVENOUS

## 2020-10-07 MED ORDER — MOXIFLOXACIN HCL 0.5 % OP SOLN
OPHTHALMIC | Status: DC | PRN
  Administered 2020-10-07: 0.2 mL via OPHTHALMIC

## 2020-10-07 MED ORDER — TETRACAINE HCL 0.5 % OP SOLN
1.0000 [drp] | OPHTHALMIC | Status: DC | PRN
  Administered 2020-10-07 (×3): 1 [drp] via OPHTHALMIC

## 2020-10-07 MED ORDER — SODIUM HYALURONATE 23 MG/ML IO SOLN
INTRAOCULAR | Status: DC | PRN
  Administered 2020-10-07: 0.6 mL via INTRAOCULAR

## 2020-10-07 MED ORDER — LIDOCAINE HCL (PF) 2 % IJ SOLN: INTRAOCULAR | Status: DC | PRN

## 2020-10-07 MED ORDER — ACETAMINOPHEN 10 MG/ML IV SOLN: 1000.0000 mg | Freq: Once | INTRAVENOUS | Status: DC | PRN

## 2020-10-07 SURGICAL SUPPLY — 19 items
CANNULA ANT/CHMB 27G (MISCELLANEOUS) ×2 IMPLANT
CANNULA ANT/CHMB 27GA (MISCELLANEOUS) ×6 IMPLANT
DISSECTOR HYDRO NUCLEUS 50X22 (MISCELLANEOUS) ×3 IMPLANT
GLOVE SURG LX 7.5 STRW (GLOVE) ×2
GLOVE SURG LX STRL 7.5 STRW (GLOVE) ×1 IMPLANT
GLOVE SURG SYN 8.5  E (GLOVE) ×3
GLOVE SURG SYN 8.5 E (GLOVE) ×1 IMPLANT
GLOVE SURG SYN 8.5 PF PI (GLOVE) ×1 IMPLANT
GOWN STRL REUS W/ TWL LRG LVL3 (GOWN DISPOSABLE) ×2 IMPLANT
GOWN STRL REUS W/TWL LRG LVL3 (GOWN DISPOSABLE) ×6
LENS IOL TECNIS EYHANCE 28.0 (Intraocular Lens) ×3 IMPLANT
MARKER SKIN DUAL TIP RULER LAB (MISCELLANEOUS) ×3 IMPLANT
PACK DR. KING ARMS (PACKS) ×3 IMPLANT
PACK EYE AFTER SURG (MISCELLANEOUS) ×3 IMPLANT
PACK OPTHALMIC (MISCELLANEOUS) ×3 IMPLANT
SYR 3ML LL SCALE MARK (SYRINGE) ×3 IMPLANT
SYR TB 1ML LUER SLIP (SYRINGE) ×3 IMPLANT
WATER STERILE IRR 250ML POUR (IV SOLUTION) ×3 IMPLANT
WIPE NON LINTING 3.25X3.25 (MISCELLANEOUS) ×3 IMPLANT

## 2020-10-07 NOTE — Transfer of Care (Signed)
Immediate Anesthesia Transfer of Care Note  [name]  Procedure(s) Performed: CATARACT EXTRACTION PHACO AND INTRAOCULAR LENS PLACEMENT (IOC) RIGHT 2.56 00:30.2 (Right Eye)  Patient Location: PACU  Anesthesia Type: MAC  Level of Consciousness: awake, alert  and patient cooperative  Airway and Oxygen Therapy: Patient Spontanous Breathing and Patient connected to supplemental oxygen  Post-op Assessment: Post-op Vital signs reviewed, Patient's Cardiovascular Status Stable, Respiratory Function Stable, Patent Airway and No signs of Nausea or vomiting  Post-op Vital Signs: Reviewed and stable  Complications: No complications documented.

## 2020-10-07 NOTE — Anesthesia Procedure Notes (Signed)
Date/Time: 10/07/2020 12:18 PM Performed by: Dionne Bucy, CRNA Oxygen Delivery Method: Nasal cannula Placement Confirmation: positive ETCO2

## 2020-10-07 NOTE — H&P (Signed)
[facility]   Primary Care Physician:  Philmore Pali, NP Ophthalmologist: Dr. Benay Pillow  Pre-Procedure History & Physical: HPI:  Leah Mitchell is a 74 y.o. female here for cataract surgery.   Past Medical History:  Diagnosis Date  . Back pain   . Crohn disease ([place])   . GERD (gastroesophageal reflux disease)    no meds  . Hypertension   . Wears glasses   . Wears partial dentures    upper partial    Past Surgical History:  Procedure Laterality Date  . BACK SURGERY  91,2001   lumb lam  . KNEE ARTHROSCOPY  2010   right   . KNEE ARTHROSCOPY Left 03/24/2013   Procedure: ARTHROSCOPY KNEE MEDIAL PLICA EXCISION, AND  PARTIAL MEDIAL MENISECTOMY;  Surgeon: Alta Corning, MD;  Location: [facility];  Service: Orthopedics;  Laterality: Left;  . LUMBAR LAMINECTOMY  2012   micerodiskectomy  . TONSILLECTOMY    . TRIGGER FINGER RELEASE  2006   rt long  . UPPER GI ENDOSCOPY      Prior to Admission medications   Medication Sig Start Date End Date Taking? Authorizing Provider  acetaminophen (TYLENOL) 500 MG tablet Take 500 mg by mouth every 6 (six) hours as needed for pain.   Yes [provider]  amLODipine (NORVASC) 5 MG tablet Take 5 mg by mouth daily. 03/22/20  Yes [provider]  ASTAXANTHIN PO Take by mouth.   Yes [provider]  atorvastatin (LIPITOR) 10 MG tablet Take 10 mg by mouth at bedtime. 02/03/20  Yes [provider]  B Complex Vitamins (VITAMIN B-COMPLEX PO) Take 1 tablet by mouth daily.   Yes [provider]  Cholecalciferol (VITAMIN D-3 PO) Take 1 tablet by mouth daily.   Yes [provider]  CINNAMON PO Take 1 tablet by mouth 2 (two) times daily.   Yes [provider]  Coenzyme Q10 (CO Q10 PO) Take by mouth daily.   Yes [provider]  fish oil-omega-3 fatty acids 1000 MG capsule Take 1 g by mouth 2 (two) times daily.   Yes [provider]  fluticasone (FLONASE) 50  MCG/ACT nasal spray Place 2 sprays into both nostrils daily. 04/02/20  Yes [provider]  LECITHIN PO Take by mouth.   Yes [provider]  Menaquinone-7 (VITAMIN K2 PO) Take by mouth daily.   Yes [provider]  MILK THISTLE PO Take by mouth daily.   Yes [provider]  Multiple Vitamin (MULTIVITAMIN WITH MINERALS) TABS Take 1 tablet by mouth daily. Ultimate 10 + probiotics   Yes [provider]  vitamin E 400 UNIT capsule Take 400 Units by mouth daily.   Yes [provider]    Allergies as of 07/19/2020 - Review Complete 04/23/2020  Allergen Reaction Noted  . Aspirin Other (See Comments) 03/21/2013  . Prednisone Rash 03/21/2013  . Sulfa antibiotics Rash 03/21/2013    History reviewed. No pertinent family history.  Social History   Socioeconomic History  . Marital status: Married    Spouse name: Not on file  . Number of children: Not on file  . Years of education: Not on file  . Highest education level: Not on file  Occupational History  . Not on file  Tobacco Use  . Smoking status: Former Smoker    Quit date: 03/23/1973    Years since quitting: 47.5  . Smokeless tobacco: Never Used  Vaping Use  . Vaping  Use: Never used  Substance and Sexual Activity  . Alcohol use: No  . Drug use: No  . Sexual activity: Not on file  Other Topics Concern  . Not on file  Social History Narrative  . Not on file   Social Determinants of Health   Financial Resource Strain:   . Difficulty of Paying Living Expenses: Not on file  Food Insecurity:   . Worried About Charity fundraiser in the Last Year: Not on file  . Ran Out of Food in the Last Year: Not on file  Transportation Needs:   . Lack of Transportation (Medical): Not on file  . Lack of Transportation (Non-Medical): Not on file  Physical Activity:   . Days of Exercise per Week: Not on file  . Minutes of Exercise per Session: Not on file  Stress:   . Feeling of Stress :  Not on file  Social Connections:   . Frequency of Communication with Friends and Family: Not on file  . Frequency of Social Gatherings with Friends and Family: Not on file  . Attends Religious Services: Not on file  . Active Member of Clubs or Organizations: Not on file  . Attends Archivist Meetings: Not on file  . Marital Status: Not on file  Intimate Partner Violence:   . Fear of Current or Ex-Partner: Not on file  . Emotionally Abused: Not on file  . Physically Abused: Not on file  . Sexually Abused: Not on file    Review of Systems: See HPI, otherwise negative ROS  Physical Exam: BP (!) 159/71   Pulse 73   Temp (!) 97 F (36.1 C) (Temporal)   Resp 16   Ht 5\' 6"  (1.676 m)   Wt 69.5 kg   SpO2 99%   BMI 24.73 kg/m  General:   Alert,  pleasant and cooperative in NAD Head:  Normocephalic and atraumatic. Respiratory:  Normal work of breathing.  Impression/Plan: Leah Mitchell is here for cataract surgery.  Risks, benefits, limitations, and alternatives regarding cataract surgery have been reviewed with the patient.  Questions have been answered.  All parties agreeable.   Benay Pillow, MD  10/07/2020, 12:05 PM

## 2020-10-07 NOTE — Op Note (Signed)
OPERATIVE NOTE  Leah Mitchell [id number] 10/07/2020   PREOPERATIVE DIAGNOSIS:  Nuclear sclerotic cataract right eye.  H25.11   POSTOPERATIVE DIAGNOSIS:    Nuclear sclerotic cataract right eye.     PROCEDURE:  Phacoemusification with posterior chamber intraocular lens placement of the right eye   LENS:   Implant Name Type Inv. Item Serial No. Manufacturer Lot No. LRB No. Used Action  LENS IOL TECNIS EYHANCE 28.0 - [id number] Intraocular Lens LENS IOL TECNIS EYHANCE 28.0 [id number] [id number]   Right 1 Implanted       Procedure(s): CATARACT EXTRACTION PHACO AND INTRAOCULAR LENS PLACEMENT (IOC) RIGHT 2.56 00:30.2 (Right)  DIB00 +28.0   ULTRASOUND TIME: 0 minutes 30 seconds.  CDE 2.56   SURGEON:  Benay Pillow, MD, MPH  ANESTHESIOLOGIST: Anesthesiologist: Ronelle Nigh, MD CRNA: Dionne Bucy, CRNA; Cameron Ali, CRNA   ANESTHESIA:  Topical with tetracaine drops augmented with 1% preservative-free intracameral lidocaine.  ESTIMATED BLOOD LOSS: less than 1 mL.   COMPLICATIONS:  None.  Secondary diagnoses: Fuch's dystrophy, regular corneal astigmatism.   DESCRIPTION OF PROCEDURE:  The patient was identified in the holding room and transported to the operating room and placed in the supine position under the operating microscope.  The right eye was identified as the operative eye and it was prepped and draped in the usual sterile ophthalmic fashion.   A 1.0 millimeter clear-corneal paracentesis was made at the 10:30 position. 0.5 ml of preservative-free 1% lidocaine with epinephrine was injected into the anterior chamber.  The anterior chamber was filled with Healon 5 viscoelastic.  A 2.4 millimeter keratome was used to make a near-clear corneal incision at the 8:00 position.  A curvilinear capsulorrhexis was made with a cystotome and capsulorrhexis forceps.  Balanced salt solution was used to hydrodissect and hydrodelineate the nucleus.   Phacoemulsification was then used in  stop and chop fashion to remove the lens nucleus and epinucleus.  The remaining cortex was then removed using the irrigation and aspiration handpiece. Healon was then placed into the capsular bag to distend it for lens placement.  A lens was then injected into the capsular bag.  The remaining viscoelastic was aspirated.   Wounds were hydrated with balanced salt solution.  The anterior chamber was inflated to a physiologic pressure with balanced salt solution.   Intracameral vigamox 0.1 mL undiluted was injected into the eye and a drop placed onto the ocular surface.  No wound leaks were noted.  The patient was taken to the recovery room in stable condition without complications of anesthesia or surgery  Benay Pillow 10/07/2020, 12:41 PM

## 2020-10-07 NOTE — Anesthesia Postprocedure Evaluation (Signed)
Anesthesia Post Note  [name]  Procedure(s) Performed: CATARACT EXTRACTION PHACO AND INTRAOCULAR LENS PLACEMENT (IOC) RIGHT 2.56 00:30.2 (Right Eye)     Patient location during evaluation: PACU Anesthesia Type: MAC Level of consciousness: awake and alert and oriented Pain management: satisfactory to patient Vital Signs Assessment: post-procedure vital signs reviewed and stable Respiratory status: spontaneous breathing, nonlabored ventilation and respiratory function stable Cardiovascular status: blood pressure returned to baseline and stable Postop Assessment: Adequate PO intake and No signs of nausea or vomiting Anesthetic complications: no   No complications documented.  Raliegh Ip

## 2020-10-08 ENCOUNTER — Encounter: Payer: Self-pay | Admitting: Ophthalmology

## 2020-10-08 DIAGNOSIS — Z23 Encounter for immunization: Secondary | ICD-10-CM | POA: Diagnosis not present

## 2020-10-15 ENCOUNTER — Other Ambulatory Visit: Payer: Self-pay

## 2020-10-15 ENCOUNTER — Encounter: Payer: Self-pay | Admitting: Ophthalmology

## 2020-10-17 ENCOUNTER — Other Ambulatory Visit
Admission: RE | Admit: 2020-10-17 | Discharge: 2020-10-17 | Disposition: A | Discharge status: Home / Self Care | Payer: Medicare Other | Source: Ambulatory Visit | Attending: Ophthalmology | Admitting: Ophthalmology

## 2020-10-17 ENCOUNTER — Other Ambulatory Visit: Payer: Self-pay

## 2020-10-17 DIAGNOSIS — Z20822 Contact with and (suspected) exposure to covid-19: Secondary | ICD-10-CM | POA: Diagnosis not present

## 2020-10-17 DIAGNOSIS — Z01812 Encounter for preprocedural laboratory examination: Secondary | ICD-10-CM | POA: Insufficient documentation

## 2020-10-17 NOTE — Discharge Instructions (Signed)

## 2020-10-18 LAB — SARS CORONAVIRUS 2 (TAT 6-24 HRS): SARS Coronavirus 2: NEGATIVE

## 2020-10-21 ENCOUNTER — Ambulatory Visit
Admission: RE | Admit: 2020-10-21 | Discharge: 2020-10-21 | Disposition: A | Discharge status: Home / Self Care | Payer: Medicare Other | Attending: Ophthalmology | Admitting: Ophthalmology

## 2020-10-21 ENCOUNTER — Ambulatory Visit: Payer: Medicare Other | Admitting: Anesthesiology

## 2020-10-21 ENCOUNTER — Other Ambulatory Visit: Payer: Self-pay

## 2020-10-21 ENCOUNTER — Encounter: Payer: Self-pay | Admitting: Ophthalmology

## 2020-10-21 ENCOUNTER — Encounter
Admission: RE | Disposition: A | Discharge status: Home / Self Care | Payer: Self-pay | Source: Home / Self Care | Attending: Ophthalmology

## 2020-10-21 DIAGNOSIS — H2512 Age-related nuclear cataract, left eye: Secondary | ICD-10-CM | POA: Insufficient documentation

## 2020-10-21 DIAGNOSIS — Z882 Allergy status to sulfonamides status: Secondary | ICD-10-CM | POA: Insufficient documentation

## 2020-10-21 DIAGNOSIS — Z888 Allergy status to other drugs, medicaments and biological substances status: Secondary | ICD-10-CM | POA: Diagnosis not present

## 2020-10-21 DIAGNOSIS — Z87891 Personal history of nicotine dependence: Secondary | ICD-10-CM | POA: Insufficient documentation

## 2020-10-21 DIAGNOSIS — Z886 Allergy status to analgesic agent status: Secondary | ICD-10-CM | POA: Insufficient documentation

## 2020-10-21 DIAGNOSIS — H25812 Combined forms of age-related cataract, left eye: Secondary | ICD-10-CM | POA: Diagnosis not present

## 2020-10-21 DIAGNOSIS — Z79899 Other long term (current) drug therapy: Secondary | ICD-10-CM | POA: Insufficient documentation

## 2020-10-21 HISTORY — PX: CATARACT EXTRACTION W/PHACO: SHX586

## 2020-10-21 SURGERY — PHACOEMULSIFICATION, CATARACT, WITH IOL INSERTION
Anesthesia: Monitor Anesthesia Care | Site: Eye | Laterality: Left

## 2020-10-21 MED ORDER — EPINEPHRINE PF 1 MG/ML IJ SOLN
INTRAOCULAR | Status: DC | PRN
  Administered 2020-10-21: 49 mL via OPHTHALMIC

## 2020-10-21 MED ORDER — TETRACAINE HCL 0.5 % OP SOLN
1.0000 [drp] | OPHTHALMIC | Status: DC | PRN
  Administered 2020-10-21 (×3): 1 [drp] via OPHTHALMIC

## 2020-10-21 MED ORDER — FENTANYL CITRATE (PF) 100 MCG/2ML IJ SOLN
INTRAMUSCULAR | Status: DC | PRN
  Administered 2020-10-21: 100 ug via INTRAVENOUS

## 2020-10-21 MED ORDER — MOXIFLOXACIN HCL 0.5 % OP SOLN
OPHTHALMIC | Status: DC | PRN
  Administered 2020-10-21: 0.2 mL via OPHTHALMIC

## 2020-10-21 MED ORDER — LACTATED RINGERS IV SOLN: INTRAVENOUS | Status: DC

## 2020-10-21 MED ORDER — SODIUM HYALURONATE 10 MG/ML IO SOLN
INTRAOCULAR | Status: DC | PRN
  Administered 2020-10-21: 0.55 mL via INTRAOCULAR

## 2020-10-21 MED ORDER — ARMC OPHTHALMIC DILATING DROPS
1.0000 "application " | OPHTHALMIC | Status: DC | PRN
  Administered 2020-10-21 (×3): 1 via OPHTHALMIC

## 2020-10-21 MED ORDER — MIDAZOLAM HCL 2 MG/2ML IJ SOLN
INTRAMUSCULAR | Status: DC | PRN
  Administered 2020-10-21: 2 mg via INTRAVENOUS

## 2020-10-21 MED ORDER — LIDOCAINE HCL (PF) 2 % IJ SOLN
INTRAOCULAR | Status: DC | PRN
  Administered 2020-10-21: 1 mL via INTRAOCULAR

## 2020-10-21 MED ORDER — SODIUM HYALURONATE 23 MG/ML IO SOLN
INTRAOCULAR | Status: DC | PRN
  Administered 2020-10-21: 0.6 mL via INTRAOCULAR

## 2020-10-21 SURGICAL SUPPLY — 19 items
CANNULA ANT/CHMB 27G (MISCELLANEOUS) ×2 IMPLANT
CANNULA ANT/CHMB 27GA (MISCELLANEOUS) ×6 IMPLANT
DISSECTOR HYDRO NUCLEUS 50X22 (MISCELLANEOUS) ×3 IMPLANT
GLOVE SURG LX 7.5 STRW (GLOVE) ×4
GLOVE SURG LX STRL 7.5 STRW (GLOVE) ×1 IMPLANT
GLOVE SURG SYN 8.5  E (GLOVE) ×3
GLOVE SURG SYN 8.5 E (GLOVE) ×1 IMPLANT
GLOVE SURG SYN 8.5 PF PI (GLOVE) ×1 IMPLANT
GOWN STRL REUS W/ TWL LRG LVL3 (GOWN DISPOSABLE) ×2 IMPLANT
GOWN STRL REUS W/TWL LRG LVL3 (GOWN DISPOSABLE) ×6
LENS IOL TECNIS EYHANCE 27.0 (Intraocular Lens) ×2 IMPLANT
MARKER SKIN DUAL TIP RULER LAB (MISCELLANEOUS) ×3 IMPLANT
PACK DR. KING ARMS (PACKS) ×3 IMPLANT
PACK EYE AFTER SURG (MISCELLANEOUS) ×3 IMPLANT
PACK OPTHALMIC (MISCELLANEOUS) ×3 IMPLANT
SYR 3ML LL SCALE MARK (SYRINGE) ×3 IMPLANT
SYR TB 1ML LUER SLIP (SYRINGE) ×3 IMPLANT
WATER STERILE IRR 250ML POUR (IV SOLUTION) ×3 IMPLANT
WIPE NON LINTING 3.25X3.25 (MISCELLANEOUS) ×3 IMPLANT

## 2020-10-21 NOTE — Transfer of Care (Signed)
Immediate Anesthesia Transfer of Care Note  [name]  Procedure(s) Performed: CATARACT EXTRACTION PHACO AND INTRAOCULAR LENS PLACEMENT (IOC) LEFT (Left Eye)  Patient Location: PACU  Anesthesia Type: MAC  Level of Consciousness: awake, alert  and patient cooperative  Airway and Oxygen Therapy: Patient Spontanous Breathing and Patient connected to supplemental oxygen  Post-op Assessment: Post-op Vital signs reviewed, Patient's Cardiovascular Status Stable, Respiratory Function Stable, Patent Airway and No signs of Nausea or vomiting  Post-op Vital Signs: Reviewed and stable  Complications: No complications documented.

## 2020-10-21 NOTE — Anesthesia Preprocedure Evaluation (Signed)
Anesthesia Evaluation  Patient identified by MRN, date of birth, ID band Patient awake    Reviewed: Allergy & Precautions, H&P , NPO status , Patient's Chart, lab work & pertinent test results  History of Anesthesia Complications Negative for: history of anesthetic complications  Airway Mallampati: I  TM Distance: >3 FB Neck ROM: full    Dental no notable dental hx.    Pulmonary neg pulmonary ROS, former smoker,    Pulmonary exam normal        Cardiovascular hypertension, On Medications  Rhythm:regular Rate:Normal     Neuro/Psych negative neurological ROS  negative psych ROS   GI/Hepatic Neg liver ROS, Medicated,  Endo/Other  negative endocrine ROS  Renal/GU negative Renal ROS  negative genitourinary   Musculoskeletal   Abdominal   Peds  Hematology negative hematology ROS (+)   Anesthesia Other Findings   Reproductive/Obstetrics                             Anesthesia Physical Anesthesia Plan  ASA: II  Anesthesia Plan: MAC   Post-op Pain Management:    Induction:   PONV Risk Score and Plan: 2 and Treatment may vary due to age or medical condition  Airway Management Planned:   Additional Equipment:   Intra-op Plan:   Post-operative Plan:   Informed Consent: I have reviewed the patients History and Physical, chart, labs and discussed the procedure including the risks, benefits and alternatives for the proposed anesthesia with the patient or authorized representative who has indicated his/her understanding and acceptance.       Plan Discussed with:   Anesthesia Plan Comments:         Anesthesia Quick Evaluation

## 2020-10-21 NOTE — Anesthesia Postprocedure Evaluation (Signed)
Anesthesia Post Note  [name]  Procedure(s) Performed: CATARACT EXTRACTION PHACO AND INTRAOCULAR LENS PLACEMENT (IOC) LEFT (Left Eye)     Patient location during evaluation: PACU Anesthesia Type: MAC Level of consciousness: awake and alert Pain management: pain level controlled Vital Signs Assessment: post-procedure vital signs reviewed and stable Respiratory status: spontaneous breathing Cardiovascular status: stable Anesthetic complications: no   No complications documented.  Gillian Scarce

## 2020-10-21 NOTE — Op Note (Signed)
OPERATIVE NOTE  XAN SPARKMAN [id number] 10/21/2020   PREOPERATIVE DIAGNOSIS:  Nuclear sclerotic cataract left eye.  H25.12   POSTOPERATIVE DIAGNOSIS:    Nuclear sclerotic cataract left eye.     PROCEDURE:  Phacoemusification with posterior chamber intraocular lens placement of the left eye   LENS:   Implant Name Type Inv. Item Serial No. Manufacturer Lot No. LRB No. Used Action  LENS IOL TECNIS EYHANCE 27.0 - [id number] Intraocular Lens LENS IOL TECNIS EYHANCE 27.0 [id number] JOHNSON   Left 1 Implanted      Procedure(s) with comments: CATARACT EXTRACTION PHACO AND INTRAOCULAR LENS PLACEMENT (IOC) LEFT (Left) - 1.64 0:19.0  DIB00 +27.0   ULTRASOUND TIME: 0 minutes 19 seconds.  CDE 1.64   SURGEON:  Benay Pillow, MD, MPH   ANESTHESIA:  Topical with tetracaine drops augmented with 1% preservative-free intracameral lidocaine.  ESTIMATED BLOOD LOSS: <1 mL   COMPLICATIONS:  None.   DESCRIPTION OF PROCEDURE:  The patient was identified in the holding room and transported to the operating room and placed in the supine position under the operating microscope.  The left eye was identified as the operative eye and it was prepped and draped in the usual sterile ophthalmic fashion.   A 1.0 millimeter clear-corneal paracentesis was made at the 5:00 position. 0.5 ml of preservative-free 1% lidocaine with epinephrine was injected into the anterior chamber.  The anterior chamber was filled with Healon 5 viscoelastic.  A 2.4 millimeter keratome was used to make a near-clear corneal incision at the 2:00 position.  A curvilinear capsulorrhexis was made with a cystotome and capsulorrhexis forceps.  Balanced salt solution was used to hydrodissect and hydrodelineate the nucleus.   Phacoemulsification was then used in stop and chop fashion to remove the lens nucleus and epinucleus.  The remaining cortex was then removed using the irrigation and aspiration handpiece. Healon was then placed into the  capsular bag to distend it for lens placement.  A lens was then injected into the capsular bag.  The remaining viscoelastic was aspirated.   Wounds were hydrated with balanced salt solution.  The anterior chamber was inflated to a physiologic pressure with balanced salt solution.  Intracameral vigamox 0.1 mL undiltued was injected into the eye and a drop placed onto the ocular surface. No wound leaks were noted.  The patient was taken to the recovery room in stable condition without complications of anesthesia or surgery  Benay Pillow 10/21/2020, 10:21 AM

## 2020-10-21 NOTE — H&P (Signed)
[facility]   Primary Care Physician:  Philmore Pali, NP Ophthalmologist: Dr. Benay Pillow  Pre-Procedure History & Physical: HPI:  Leah Mitchell is a 74 y.o. female here for cataract surgery.   Past Medical History:  Diagnosis Date  . Back pain   . Crohn disease ([place])   . GERD (gastroesophageal reflux disease)    no meds  . Hypertension   . Wears glasses   . Wears partial dentures    upper partial    Past Surgical History:  Procedure Laterality Date  . BACK SURGERY  91,2001   lumb lam  . CATARACT EXTRACTION W/PHACO Right 10/07/2020   Procedure: CATARACT EXTRACTION PHACO AND INTRAOCULAR LENS PLACEMENT (IOC) RIGHT 2.56 00:30.2;  Surgeon: Eulogio Bear, MD;  Location: [facility];  Service: Ophthalmology;  Laterality: Right;  . KNEE ARTHROSCOPY  2010   right   . KNEE ARTHROSCOPY Left 03/24/2013   Procedure: ARTHROSCOPY KNEE MEDIAL PLICA EXCISION, AND  PARTIAL MEDIAL MENISECTOMY;  Surgeon: Alta Corning, MD;  Location: [facility];  Service: Orthopedics;  Laterality: Left;  . LUMBAR LAMINECTOMY  2012   micerodiskectomy  . TONSILLECTOMY    . TRIGGER FINGER RELEASE  2006   rt long  . UPPER GI ENDOSCOPY      Prior to Admission medications   Medication Sig Start Date End Date Taking? Authorizing Provider  acetaminophen (TYLENOL) 500 MG tablet Take 500 mg by mouth every 6 (six) hours as needed for pain.   Yes [provider]  amLODipine (NORVASC) 5 MG tablet Take 5 mg by mouth daily. 03/22/20  Yes [provider]  ASTAXANTHIN PO Take by mouth.   Yes [provider]  atorvastatin (LIPITOR) 10 MG tablet Take 10 mg by mouth at bedtime. 02/03/20  Yes [provider]  B Complex Vitamins (VITAMIN B-COMPLEX PO) Take 1 tablet by mouth daily.   Yes [provider]  Cholecalciferol (VITAMIN D-3 PO) Take 1 tablet by mouth daily.   Yes [provider]  CINNAMON PO Take 1 tablet by mouth 2 (two) times daily.    Yes [provider]  Coenzyme Q10 (CO Q10 PO) Take by mouth daily.   Yes [provider]  fish oil-omega-3 fatty acids 1000 MG capsule Take 1 g by mouth 2 (two) times daily.   Yes [provider]  fluticasone (FLONASE) 50 MCG/ACT nasal spray Place 2 sprays into both nostrils daily. 04/02/20  Yes [provider]  LECITHIN PO Take by mouth.   Yes [provider]  Menaquinone-7 (VITAMIN K2 PO) Take by mouth daily.   Yes [provider]  MILK THISTLE PO Take by mouth daily.   Yes [provider]  Multiple Vitamin (MULTIVITAMIN WITH MINERALS) TABS Take 1 tablet by mouth daily. Ultimate 10 + probiotics   Yes [provider]  vitamin E 400 UNIT capsule Take 400 Units by mouth daily.   Yes [provider]    Allergies as of 10/09/2020 - Review Complete 10/07/2020  Allergen Reaction Noted  . Aspirin Other (See Comments) 03/21/2013  . Prednisone Rash 03/21/2013  . Sulfa antibiotics Rash 03/21/2013    History reviewed. No pertinent family history.  Social History   Socioeconomic History  . Marital status: Married    Spouse name: Not on file  . Number of children: Not on file  . Years of education: Not on file  . Highest education level: Not on file  Occupational History  .  Not on file  Tobacco Use  . Smoking status: Former Smoker    Quit date: 03/23/1973    Years since quitting: 47.6  . Smokeless tobacco: Never Used  Vaping Use  . Vaping Use: Never used  Substance and Sexual Activity  . Alcohol use: No  . Drug use: No  . Sexual activity: Not on file  Other Topics Concern  . Not on file  Social History Narrative  . Not on file   Social Determinants of Health   Financial Resource Strain:   . Difficulty of Paying Living Expenses: Not on file  Food Insecurity:   . Worried About Charity fundraiser in the Last Year: Not on file  . Ran Out of Food in the Last Year: Not on file  Transportation Needs:    . Lack of Transportation (Medical): Not on file  . Lack of Transportation (Non-Medical): Not on file  Physical Activity:   . Days of Exercise per Week: Not on file  . Minutes of Exercise per Session: Not on file  Stress:   . Feeling of Stress : Not on file  Social Connections:   . Frequency of Communication with Friends and Family: Not on file  . Frequency of Social Gatherings with Friends and Family: Not on file  . Attends Religious Services: Not on file  . Active Member of Clubs or Organizations: Not on file  . Attends Archivist Meetings: Not on file  . Marital Status: Not on file  Intimate Partner Violence:   . Fear of Current or Ex-Partner: Not on file  . Emotionally Abused: Not on file  . Physically Abused: Not on file  . Sexually Abused: Not on file    Review of Systems: See HPI, otherwise negative ROS  Physical Exam: BP (!) 154/66   Pulse 64   Temp (!) 97 F (36.1 C) (Temporal)   Resp 18   Ht 5\' 6"  (1.676 m)   Wt 69.4 kg   SpO2 99%   BMI 24.69 kg/m  General:   Alert,  pleasant and cooperative in NAD Head:  Normocephalic and atraumatic. Respiratory:  Normal work of breathing.  Impression/Plan: Leah Mitchell is here for cataract surgery.  Risks, benefits, limitations, and alternatives regarding cataract surgery have been reviewed with the patient.  Questions have been answered.  All parties agreeable.   Benay Pillow, MD  10/21/2020, 9:53 AM

## 2020-10-21 NOTE — Anesthesia Procedure Notes (Signed)
Procedure Name: MAC Date/Time: 10/21/2020 10:04 AM Performed by: Silvana Newness, CRNA Pre-anesthesia Checklist: Patient identified, Emergency Drugs available, Suction available, Patient being monitored and Timeout performed Patient Re-evaluated:Patient Re-evaluated prior to induction Oxygen Delivery Method: Nasal cannula Placement Confirmation: positive ETCO2

## 2020-10-22 ENCOUNTER — Encounter: Payer: Self-pay | Admitting: Ophthalmology

## 2021-01-06 DIAGNOSIS — G3184 Mild cognitive impairment, so stated: Secondary | ICD-10-CM | POA: Diagnosis not present

## 2021-01-06 DIAGNOSIS — H18519 Endothelial corneal dystrophy, unspecified eye: Secondary | ICD-10-CM | POA: Diagnosis not present

## 2021-01-06 DIAGNOSIS — R296 Repeated falls: Secondary | ICD-10-CM | POA: Diagnosis not present

## 2021-01-06 DIAGNOSIS — I10 Essential (primary) hypertension: Secondary | ICD-10-CM | POA: Diagnosis not present

## 2021-01-06 DIAGNOSIS — Z6825 Body mass index (BMI) 25.0-25.9, adult: Secondary | ICD-10-CM | POA: Diagnosis not present

## 2021-03-07 DIAGNOSIS — H26493 Other secondary cataract, bilateral: Secondary | ICD-10-CM | POA: Diagnosis not present

## 2021-04-10 DIAGNOSIS — H26493 Other secondary cataract, bilateral: Secondary | ICD-10-CM | POA: Diagnosis not present

## 2021-04-14 DIAGNOSIS — E785 Hyperlipidemia, unspecified: Secondary | ICD-10-CM | POA: Diagnosis not present

## 2021-04-14 DIAGNOSIS — Z0000 Encounter for general adult medical examination without abnormal findings: Secondary | ICD-10-CM | POA: Diagnosis not present

## 2021-04-14 DIAGNOSIS — Z1331 Encounter for screening for depression: Secondary | ICD-10-CM | POA: Diagnosis not present

## 2021-04-14 DIAGNOSIS — Z9181 History of falling: Secondary | ICD-10-CM | POA: Diagnosis not present

## 2021-04-18 DIAGNOSIS — I10 Essential (primary) hypertension: Secondary | ICD-10-CM | POA: Diagnosis not present

## 2021-04-18 DIAGNOSIS — R296 Repeated falls: Secondary | ICD-10-CM | POA: Diagnosis not present

## 2021-04-18 DIAGNOSIS — Z6825 Body mass index (BMI) 25.0-25.9, adult: Secondary | ICD-10-CM | POA: Diagnosis not present

## 2021-04-18 DIAGNOSIS — H18519 Endothelial corneal dystrophy, unspecified eye: Secondary | ICD-10-CM | POA: Diagnosis not present

## 2021-04-18 DIAGNOSIS — G3184 Mild cognitive impairment, so stated: Secondary | ICD-10-CM | POA: Diagnosis not present

## 2021-04-18 DIAGNOSIS — J302 Other seasonal allergic rhinitis: Secondary | ICD-10-CM | POA: Diagnosis not present

## 2021-04-18 DIAGNOSIS — K5090 Crohn's disease, unspecified, without complications: Secondary | ICD-10-CM | POA: Diagnosis not present

## 2021-04-18 DIAGNOSIS — E7800 Pure hypercholesterolemia, unspecified: Secondary | ICD-10-CM | POA: Diagnosis not present

## 2021-08-14 DIAGNOSIS — E2839 Other primary ovarian failure: Secondary | ICD-10-CM | POA: Diagnosis not present

## 2021-08-14 DIAGNOSIS — Z1231 Encounter for screening mammogram for malignant neoplasm of breast: Secondary | ICD-10-CM | POA: Diagnosis not present

## 2021-08-14 DIAGNOSIS — M810 Age-related osteoporosis without current pathological fracture: Secondary | ICD-10-CM | POA: Diagnosis not present

## 2021-08-17 DIAGNOSIS — Z23 Encounter for immunization: Secondary | ICD-10-CM | POA: Diagnosis not present

## 2021-10-21 DIAGNOSIS — M810 Age-related osteoporosis without current pathological fracture: Secondary | ICD-10-CM | POA: Diagnosis not present

## 2021-10-21 DIAGNOSIS — E7800 Pure hypercholesterolemia, unspecified: Secondary | ICD-10-CM | POA: Diagnosis not present

## 2021-10-21 DIAGNOSIS — Z79899 Other long term (current) drug therapy: Secondary | ICD-10-CM | POA: Diagnosis not present

## 2021-10-21 DIAGNOSIS — I10 Essential (primary) hypertension: Secondary | ICD-10-CM | POA: Diagnosis not present

## 2021-10-21 DIAGNOSIS — Z6825 Body mass index (BMI) 25.0-25.9, adult: Secondary | ICD-10-CM | POA: Diagnosis not present

## 2021-10-21 DIAGNOSIS — J3089 Other allergic rhinitis: Secondary | ICD-10-CM | POA: Diagnosis not present

## 2022-04-20 DIAGNOSIS — Z0000 Encounter for general adult medical examination without abnormal findings: Secondary | ICD-10-CM | POA: Diagnosis not present

## 2022-04-20 DIAGNOSIS — Z9181 History of falling: Secondary | ICD-10-CM | POA: Diagnosis not present

## 2022-04-20 DIAGNOSIS — E785 Hyperlipidemia, unspecified: Secondary | ICD-10-CM | POA: Diagnosis not present

## 2022-04-20 DIAGNOSIS — Z1331 Encounter for screening for depression: Secondary | ICD-10-CM | POA: Diagnosis not present

## 2022-04-20 DIAGNOSIS — Z139 Encounter for screening, unspecified: Secondary | ICD-10-CM | POA: Diagnosis not present

## 2022-04-22 DIAGNOSIS — M810 Age-related osteoporosis without current pathological fracture: Secondary | ICD-10-CM | POA: Diagnosis not present

## 2022-04-22 DIAGNOSIS — J3089 Other allergic rhinitis: Secondary | ICD-10-CM | POA: Diagnosis not present

## 2022-04-22 DIAGNOSIS — I10 Essential (primary) hypertension: Secondary | ICD-10-CM | POA: Diagnosis not present

## 2022-04-22 DIAGNOSIS — R413 Other amnesia: Secondary | ICD-10-CM | POA: Diagnosis not present

## 2022-04-22 DIAGNOSIS — Z79899 Other long term (current) drug therapy: Secondary | ICD-10-CM | POA: Diagnosis not present

## 2022-04-22 DIAGNOSIS — E7800 Pure hypercholesterolemia, unspecified: Secondary | ICD-10-CM | POA: Diagnosis not present

## 2022-05-07 IMAGING — CT CT HEAD W/O CM
3 series · 15 of 47 positions shown, 18 images · non-contrast
Comparison: 6 hours prior.

CLINICAL DATA: Follow-up intracranial hemorrhage.

EXAM:
CT HEAD WITHOUT CONTRAST
TECHNIQUE: Contiguous axial images were obtained from the base of the skull
through the vertex without intravenous contrast.

[Series 2: head wo · axial · 0.44mm/px · z∈[-75,+50]mm · 9 of 30 slices shown, 12 images]
[im 3/30  brain]
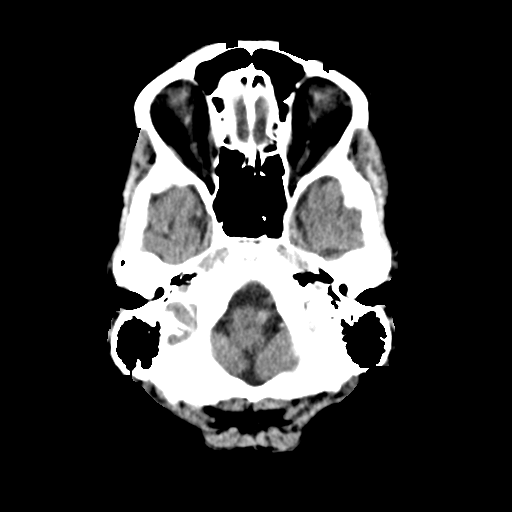
[im 3/30  bone]
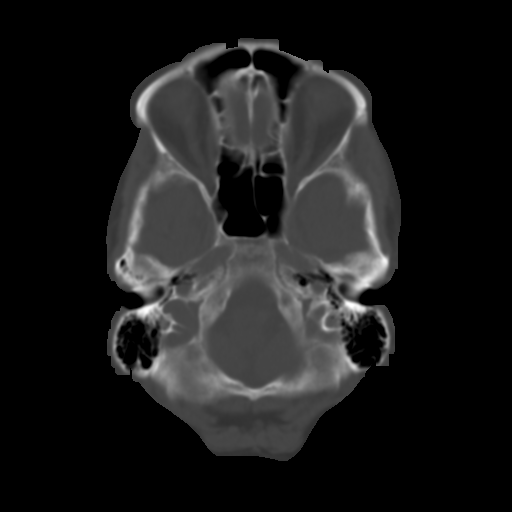
[im 6/30  brain]
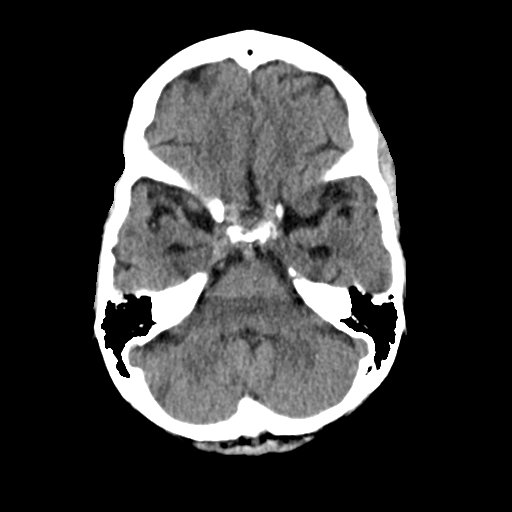
[im 9/30  brain]
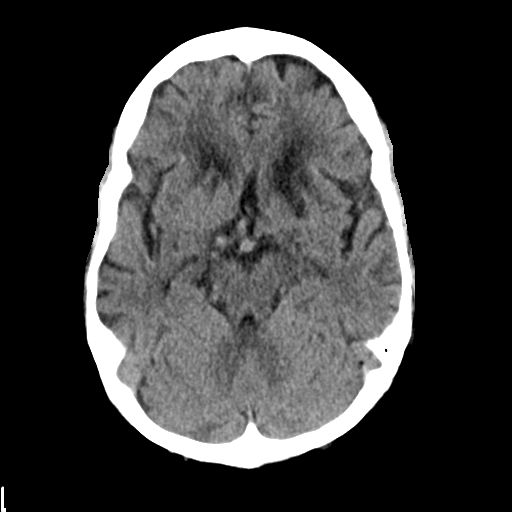
[im 12/30  brain]
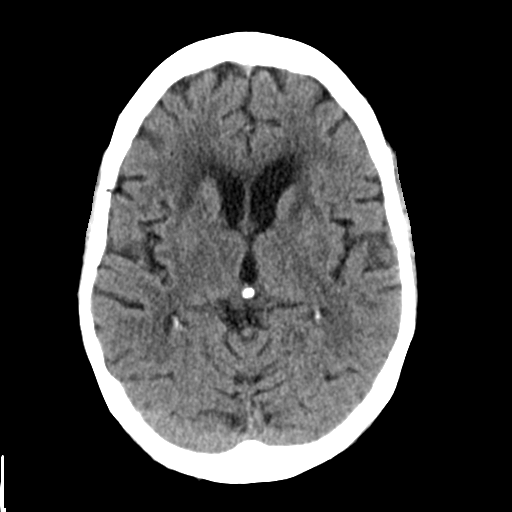
[im 16/30  brain]
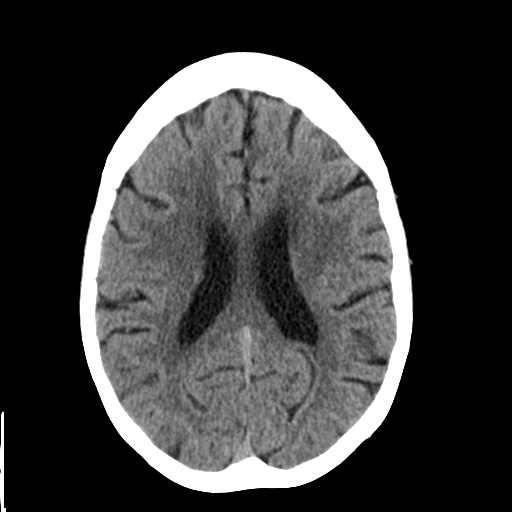
[im 16/30  bone]
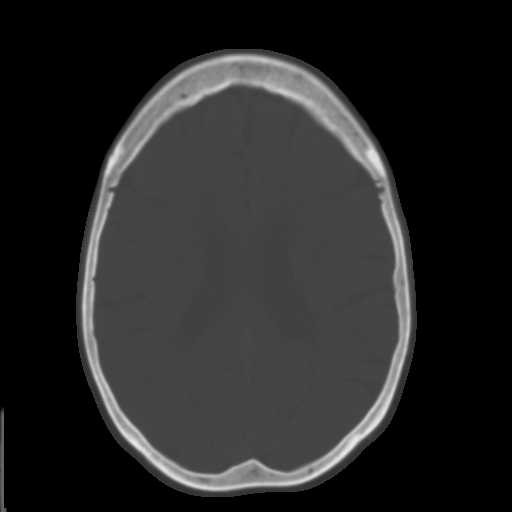
[im 19/30  brain]
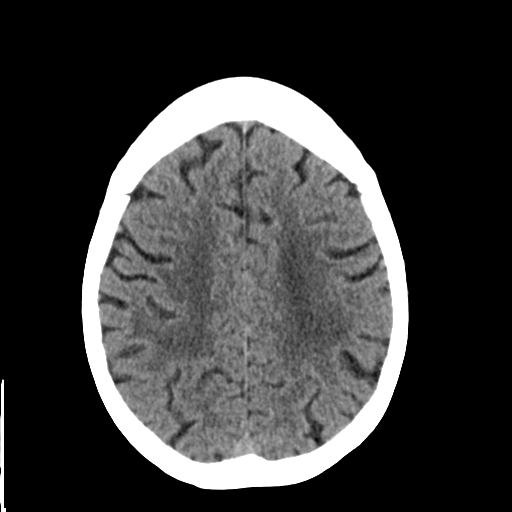
[im 22/30  brain]
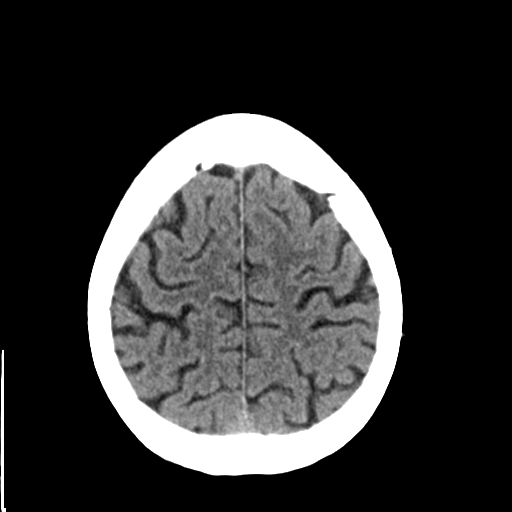
[im 25/30  brain]
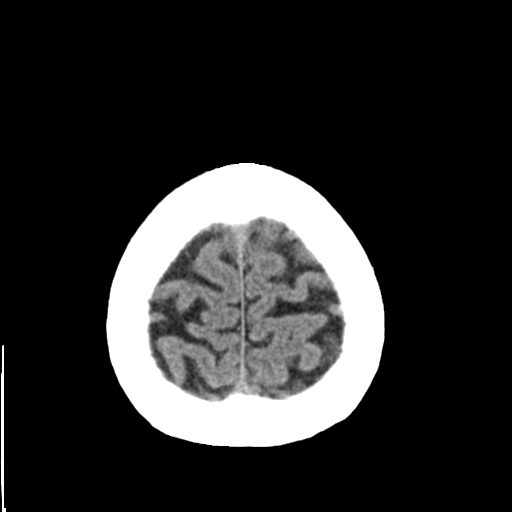
[im 28/30  brain]
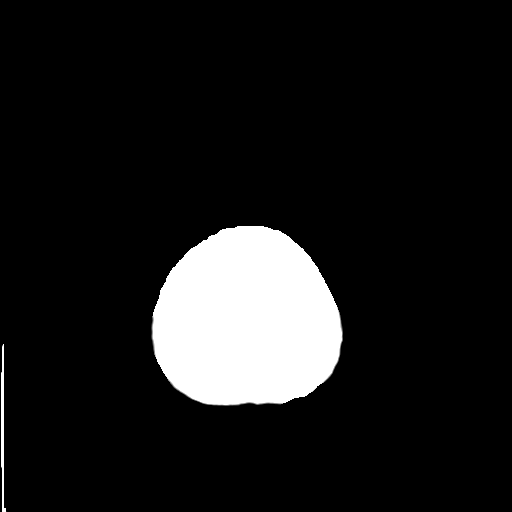
[im 28/30  bone]
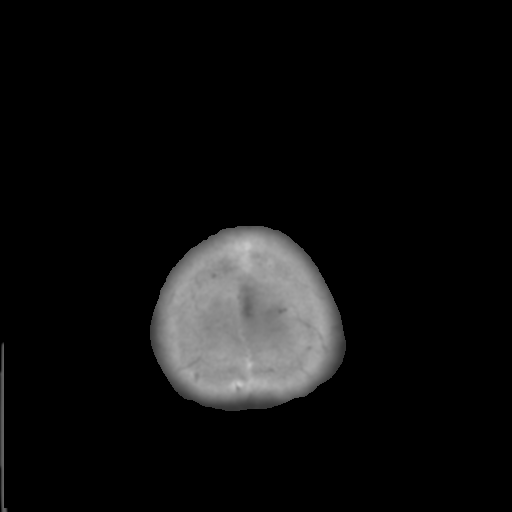

[Series 4: coronal soft tissue · coronal · 0.31mm/px · 3 of 66 slices shown]
[im 22/66  brain]
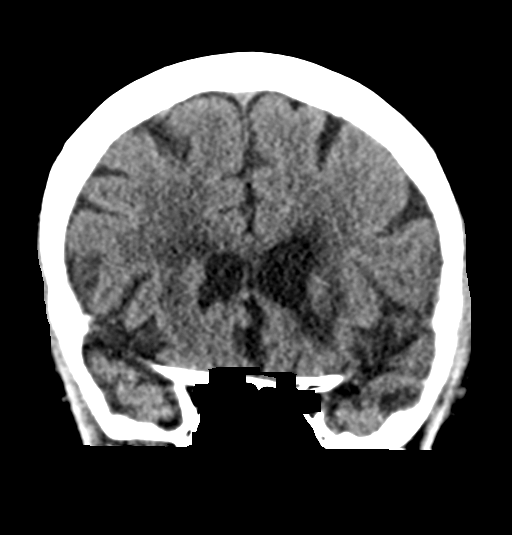
[im 29/66  brain]
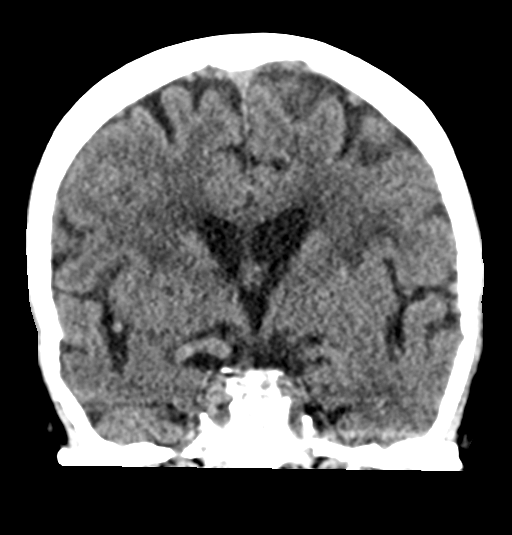
[im 37/66  brain]
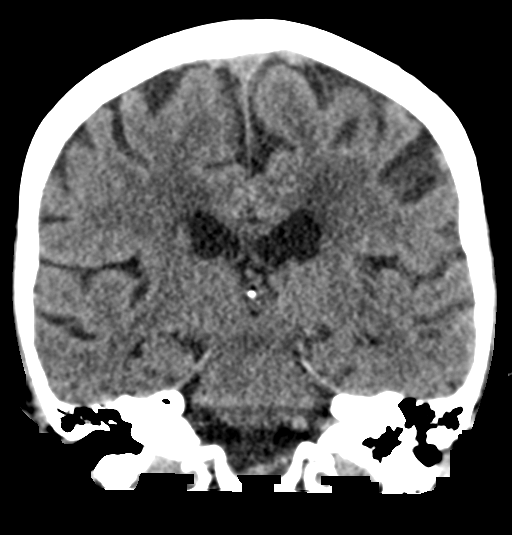

[Series 5: sagittal soft tissue · sagittal · 0.33mm/px · 3 of 49 slices shown]
[im 17/49  brain]
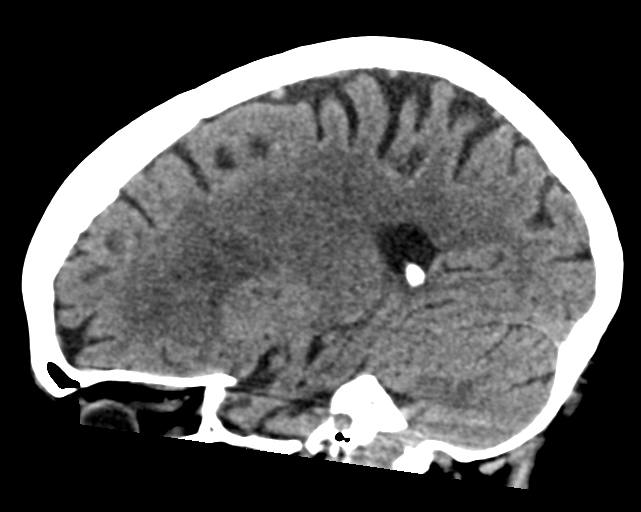
[im 25/49  brain]
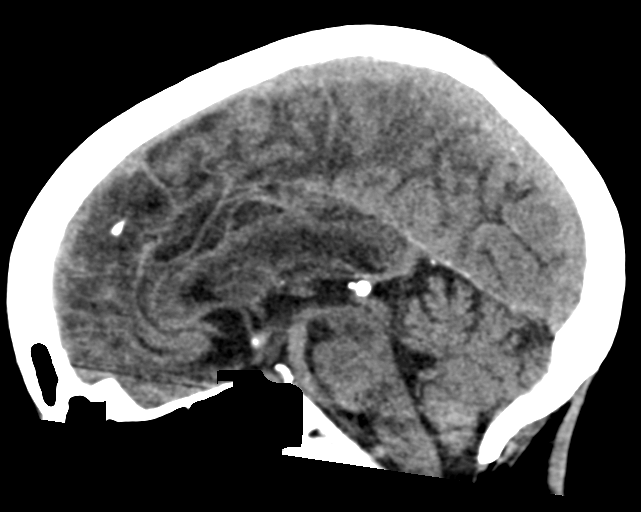
[im 33/49  brain]
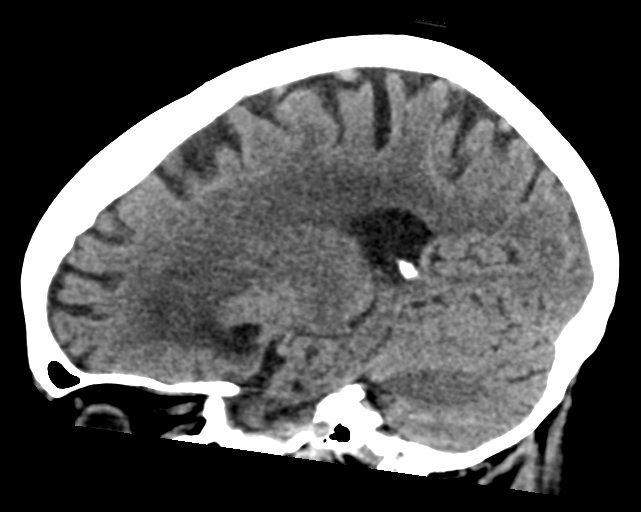

[15 of 47 positions shown; findings below may reference images not displayed]

FINDINGS: Brain: Small hemorrhage in the left frontal lobe is unchanged from
prior exam, series 2, image 11, currently appears intraparenchymal
rather than subarachnoid. No surrounding edema. No new or
progressive hemorrhage. No midline shift. Exam is otherwise
unchanged with atrophy and chronic small vessel ischemia. Remote
lacunar infarcts in the bilateral basal ganglia. 5 no subdural
collection.

Vascular: Atherosclerosis of skullbase vasculature without
hyperdense vessel or abnormal calcification.

Skull: No fracture or focal lesion.

Sinuses/Orbits: Left frontal supraorbital scalp laceration again
seen.

Other: None.
IMPRESSION: 1. Unchanged small left frontal lobe hemorrhage, favor
intraparenchymal rather than subarachnoid. No surrounding edema. No
new or progressive hemorrhage.
2. Unchanged atrophy and chronic small vessel ischemia. Remote
lacunar infarcts in the bilateral basal ganglia.

## 2022-05-14 DIAGNOSIS — H18513 Endothelial corneal dystrophy, bilateral: Secondary | ICD-10-CM | POA: Diagnosis not present

## 2022-08-23 DIAGNOSIS — Z23 Encounter for immunization: Secondary | ICD-10-CM | POA: Diagnosis not present

## 2022-10-29 ENCOUNTER — Other Ambulatory Visit: Payer: Self-pay | Admitting: Family Medicine

## 2022-10-29 DIAGNOSIS — M810 Age-related osteoporosis without current pathological fracture: Secondary | ICD-10-CM | POA: Diagnosis not present

## 2022-10-29 DIAGNOSIS — K5090 Crohn's disease, unspecified, without complications: Secondary | ICD-10-CM | POA: Diagnosis not present

## 2022-10-29 DIAGNOSIS — I10 Essential (primary) hypertension: Secondary | ICD-10-CM | POA: Diagnosis not present

## 2022-10-29 DIAGNOSIS — R413 Other amnesia: Secondary | ICD-10-CM

## 2022-10-29 DIAGNOSIS — R0609 Other forms of dyspnea: Secondary | ICD-10-CM | POA: Diagnosis not present

## 2022-10-29 DIAGNOSIS — Z79899 Other long term (current) drug therapy: Secondary | ICD-10-CM | POA: Diagnosis not present

## 2022-10-29 DIAGNOSIS — G252 Other specified forms of tremor: Secondary | ICD-10-CM | POA: Diagnosis not present

## 2022-10-29 DIAGNOSIS — Z6826 Body mass index (BMI) 26.0-26.9, adult: Secondary | ICD-10-CM | POA: Diagnosis not present

## 2022-10-29 DIAGNOSIS — Z1231 Encounter for screening mammogram for malignant neoplasm of breast: Secondary | ICD-10-CM | POA: Diagnosis not present

## 2022-10-29 DIAGNOSIS — E7800 Pure hypercholesterolemia, unspecified: Secondary | ICD-10-CM | POA: Diagnosis not present

## 2022-10-29 DIAGNOSIS — J3089 Other allergic rhinitis: Secondary | ICD-10-CM | POA: Diagnosis not present

## 2022-10-29 DIAGNOSIS — K219 Gastro-esophageal reflux disease without esophagitis: Secondary | ICD-10-CM | POA: Diagnosis not present

## 2022-11-10 ENCOUNTER — Ambulatory Visit
Admission: RE | Admit: 2022-11-10 | Discharge: 2022-11-10 | Disposition: A | Discharge status: Home / Self Care | Payer: Medicare Other | Source: Ambulatory Visit | Attending: Family Medicine | Admitting: Family Medicine

## 2022-11-10 DIAGNOSIS — R413 Other amnesia: Secondary | ICD-10-CM | POA: Diagnosis not present

## 2022-11-10 DIAGNOSIS — Z1231 Encounter for screening mammogram for malignant neoplasm of breast: Secondary | ICD-10-CM | POA: Diagnosis not present

## 2022-11-10 DIAGNOSIS — G319 Degenerative disease of nervous system, unspecified: Secondary | ICD-10-CM | POA: Diagnosis not present

## 2022-11-10 MED ORDER — GADOBUTROL 1 MMOL/ML IV SOLN
7.0000 mL | Freq: Once | INTRAVENOUS | Status: AC | PRN
  Administered 2022-11-10: 7 mL via INTRAVENOUS

## 2022-12-07 DIAGNOSIS — G319 Degenerative disease of nervous system, unspecified: Secondary | ICD-10-CM | POA: Diagnosis not present

## 2022-12-07 DIAGNOSIS — R251 Tremor, unspecified: Secondary | ICD-10-CM | POA: Diagnosis not present

## 2022-12-07 DIAGNOSIS — R413 Other amnesia: Secondary | ICD-10-CM | POA: Diagnosis not present

## 2023-04-24 DIAGNOSIS — Z882 Allergy status to sulfonamides status: Secondary | ICD-10-CM | POA: Diagnosis not present

## 2023-04-24 DIAGNOSIS — S51852A Open bite of left forearm, initial encounter: Secondary | ICD-10-CM | POA: Diagnosis not present

## 2023-04-24 DIAGNOSIS — Z888 Allergy status to other drugs, medicaments and biological substances status: Secondary | ICD-10-CM | POA: Diagnosis not present

## 2023-04-24 DIAGNOSIS — S71152A Open bite, left thigh, initial encounter: Secondary | ICD-10-CM | POA: Diagnosis not present

## 2023-04-24 DIAGNOSIS — Z87891 Personal history of nicotine dependence: Secondary | ICD-10-CM | POA: Diagnosis not present

## 2023-04-24 DIAGNOSIS — Z886 Allergy status to analgesic agent status: Secondary | ICD-10-CM | POA: Diagnosis not present

## 2023-05-03 DIAGNOSIS — M810 Age-related osteoporosis without current pathological fracture: Secondary | ICD-10-CM | POA: Diagnosis not present

## 2023-05-03 DIAGNOSIS — E7800 Pure hypercholesterolemia, unspecified: Secondary | ICD-10-CM | POA: Diagnosis not present

## 2023-05-03 DIAGNOSIS — Z9181 History of falling: Secondary | ICD-10-CM | POA: Diagnosis not present

## 2023-05-03 DIAGNOSIS — K219 Gastro-esophageal reflux disease without esophagitis: Secondary | ICD-10-CM | POA: Diagnosis not present

## 2023-05-03 DIAGNOSIS — I10 Essential (primary) hypertension: Secondary | ICD-10-CM | POA: Diagnosis not present

## 2023-05-03 DIAGNOSIS — K5090 Crohn's disease, unspecified, without complications: Secondary | ICD-10-CM | POA: Diagnosis not present

## 2023-05-03 DIAGNOSIS — R413 Other amnesia: Secondary | ICD-10-CM | POA: Diagnosis not present

## 2023-05-03 DIAGNOSIS — G319 Degenerative disease of nervous system, unspecified: Secondary | ICD-10-CM | POA: Diagnosis not present

## 2023-05-03 DIAGNOSIS — J3089 Other allergic rhinitis: Secondary | ICD-10-CM | POA: Diagnosis not present

## 2023-05-03 DIAGNOSIS — Z1331 Encounter for screening for depression: Secondary | ICD-10-CM | POA: Diagnosis not present

## 2023-05-03 DIAGNOSIS — G252 Other specified forms of tremor: Secondary | ICD-10-CM | POA: Diagnosis not present

## 2023-05-03 DIAGNOSIS — Z79899 Other long term (current) drug therapy: Secondary | ICD-10-CM | POA: Diagnosis not present

## 2023-05-03 DIAGNOSIS — Z139 Encounter for screening, unspecified: Secondary | ICD-10-CM | POA: Diagnosis not present

## 2023-05-31 DIAGNOSIS — G319 Degenerative disease of nervous system, unspecified: Secondary | ICD-10-CM | POA: Diagnosis not present

## 2023-05-31 DIAGNOSIS — R413 Other amnesia: Secondary | ICD-10-CM | POA: Diagnosis not present

## 2023-05-31 DIAGNOSIS — R251 Tremor, unspecified: Secondary | ICD-10-CM | POA: Diagnosis not present

## 2023-08-04 ENCOUNTER — Other Ambulatory Visit: Payer: Self-pay

## 2023-08-04 ENCOUNTER — Emergency Department: Payer: Medicare Other

## 2023-08-04 ENCOUNTER — Inpatient Hospital Stay
Admission: EM | Admit: 2023-08-04 | Discharge: 2023-08-11 | DRG: 481 | Disposition: A | Discharge status: Skilled Nursing Facility | Payer: Medicare Other | Attending: Internal Medicine | Admitting: Internal Medicine

## 2023-08-04 DIAGNOSIS — R41841 Cognitive communication deficit: Secondary | ICD-10-CM | POA: Diagnosis not present

## 2023-08-04 DIAGNOSIS — W010XXA Fall on same level from slipping, tripping and stumbling without subsequent striking against object, initial encounter: Secondary | ICD-10-CM | POA: Diagnosis present

## 2023-08-04 DIAGNOSIS — K5090 Crohn's disease, unspecified, without complications: Secondary | ICD-10-CM | POA: Diagnosis present

## 2023-08-04 DIAGNOSIS — R9431 Abnormal electrocardiogram [ECG] [EKG]: Secondary | ICD-10-CM | POA: Diagnosis not present

## 2023-08-04 DIAGNOSIS — K219 Gastro-esophageal reflux disease without esophagitis: Secondary | ICD-10-CM | POA: Diagnosis present

## 2023-08-04 DIAGNOSIS — D62 Acute posthemorrhagic anemia: Secondary | ICD-10-CM | POA: Diagnosis present

## 2023-08-04 DIAGNOSIS — F0390 Unspecified dementia without behavioral disturbance: Secondary | ICD-10-CM | POA: Diagnosis present

## 2023-08-04 DIAGNOSIS — S72002A Fracture of unspecified part of neck of left femur, initial encounter for closed fracture: Secondary | ICD-10-CM | POA: Diagnosis not present

## 2023-08-04 DIAGNOSIS — M25571 Pain in right ankle and joints of right foot: Secondary | ICD-10-CM | POA: Diagnosis not present

## 2023-08-04 DIAGNOSIS — E785 Hyperlipidemia, unspecified: Secondary | ICD-10-CM | POA: Diagnosis not present

## 2023-08-04 DIAGNOSIS — J984 Other disorders of lung: Secondary | ICD-10-CM | POA: Diagnosis not present

## 2023-08-04 DIAGNOSIS — N179 Acute kidney failure, unspecified: Secondary | ICD-10-CM | POA: Diagnosis present

## 2023-08-04 DIAGNOSIS — Z888 Allergy status to other drugs, medicaments and biological substances status: Secondary | ICD-10-CM

## 2023-08-04 DIAGNOSIS — D72828 Other elevated white blood cell count: Secondary | ICD-10-CM | POA: Diagnosis not present

## 2023-08-04 DIAGNOSIS — M5030 Other cervical disc degeneration, unspecified cervical region: Secondary | ICD-10-CM | POA: Diagnosis not present

## 2023-08-04 DIAGNOSIS — I10 Essential (primary) hypertension: Secondary | ICD-10-CM | POA: Diagnosis present

## 2023-08-04 DIAGNOSIS — Z79899 Other long term (current) drug therapy: Secondary | ICD-10-CM

## 2023-08-04 DIAGNOSIS — I959 Hypotension, unspecified: Secondary | ICD-10-CM | POA: Diagnosis not present

## 2023-08-04 DIAGNOSIS — Z882 Allergy status to sulfonamides status: Secondary | ICD-10-CM

## 2023-08-04 DIAGNOSIS — Z4789 Encounter for other orthopedic aftercare: Secondary | ICD-10-CM | POA: Diagnosis not present

## 2023-08-04 DIAGNOSIS — M7731 Calcaneal spur, right foot: Secondary | ICD-10-CM | POA: Diagnosis not present

## 2023-08-04 DIAGNOSIS — R600 Localized edema: Secondary | ICD-10-CM | POA: Diagnosis not present

## 2023-08-04 DIAGNOSIS — Z886 Allergy status to analgesic agent status: Secondary | ICD-10-CM | POA: Diagnosis not present

## 2023-08-04 DIAGNOSIS — Z751 Person awaiting admission to adequate facility elsewhere: Secondary | ICD-10-CM | POA: Diagnosis not present

## 2023-08-04 DIAGNOSIS — S72142A Displaced intertrochanteric fracture of left femur, initial encounter for closed fracture: Secondary | ICD-10-CM | POA: Diagnosis present

## 2023-08-04 DIAGNOSIS — M6281 Muscle weakness (generalized): Secondary | ICD-10-CM | POA: Diagnosis not present

## 2023-08-04 DIAGNOSIS — M1711 Unilateral primary osteoarthritis, right knee: Secondary | ICD-10-CM | POA: Diagnosis present

## 2023-08-04 DIAGNOSIS — M47812 Spondylosis without myelopathy or radiculopathy, cervical region: Secondary | ICD-10-CM | POA: Diagnosis not present

## 2023-08-04 DIAGNOSIS — S7222XA Displaced subtrochanteric fracture of left femur, initial encounter for closed fracture: Secondary | ICD-10-CM | POA: Diagnosis not present

## 2023-08-04 DIAGNOSIS — D72829 Elevated white blood cell count, unspecified: Secondary | ICD-10-CM | POA: Diagnosis present

## 2023-08-04 DIAGNOSIS — D508 Other iron deficiency anemias: Secondary | ICD-10-CM | POA: Diagnosis not present

## 2023-08-04 DIAGNOSIS — Z743 Need for continuous supervision: Secondary | ICD-10-CM | POA: Diagnosis not present

## 2023-08-04 DIAGNOSIS — M25552 Pain in left hip: Secondary | ICD-10-CM | POA: Diagnosis not present

## 2023-08-04 DIAGNOSIS — S0990XA Unspecified injury of head, initial encounter: Secondary | ICD-10-CM | POA: Diagnosis not present

## 2023-08-04 DIAGNOSIS — T8484XD Pain due to internal orthopedic prosthetic devices, implants and grafts, subsequent encounter: Secondary | ICD-10-CM | POA: Diagnosis not present

## 2023-08-04 DIAGNOSIS — M25561 Pain in right knee: Secondary | ICD-10-CM | POA: Diagnosis not present

## 2023-08-04 DIAGNOSIS — R519 Headache, unspecified: Secondary | ICD-10-CM | POA: Diagnosis not present

## 2023-08-04 DIAGNOSIS — S7222XD Displaced subtrochanteric fracture of left femur, subsequent encounter for closed fracture with routine healing: Secondary | ICD-10-CM | POA: Diagnosis not present

## 2023-08-04 DIAGNOSIS — I6782 Cerebral ischemia: Secondary | ICD-10-CM | POA: Diagnosis not present

## 2023-08-04 DIAGNOSIS — R0689 Other abnormalities of breathing: Secondary | ICD-10-CM | POA: Diagnosis not present

## 2023-08-04 DIAGNOSIS — S199XXA Unspecified injury of neck, initial encounter: Secondary | ICD-10-CM | POA: Diagnosis not present

## 2023-08-04 DIAGNOSIS — W19XXXA Unspecified fall, initial encounter: Secondary | ICD-10-CM | POA: Diagnosis not present

## 2023-08-04 LAB — CBC WITH DIFFERENTIAL/PLATELET
Abs Immature Granulocytes: 0.07 10*3/uL (ref 0.00–0.07)
Basophils Absolute: 0.1 10*3/uL (ref 0.0–0.1)
Basophils Relative: 0 %
Eosinophils Absolute: 0 10*3/uL (ref 0.0–0.5)
Eosinophils Relative: 0 %
HCT: 38.1 % (ref 36.0–46.0)
Hemoglobin: 12.4 g/dL (ref 12.0–15.0)
Immature Granulocytes: 1 %
Lymphocytes Relative: 11 %
Lymphs Abs: 1.7 10*3/uL (ref 0.7–4.0)
MCH: 30.8 pg (ref 26.0–34.0)
MCHC: 32.5 g/dL (ref 30.0–36.0)
MCV: 94.5 fL (ref 80.0–100.0)
Monocytes Absolute: 1.1 10*3/uL — ABNORMAL HIGH (ref 0.1–1.0)
Monocytes Relative: 7 %
Neutro Abs: 12.4 10*3/uL — ABNORMAL HIGH (ref 1.7–7.7)
Neutrophils Relative %: 81 %
Platelets: 342 10*3/uL (ref 150–400)
RBC: 4.03 MIL/uL (ref 3.87–5.11)
RDW: 13.2 % (ref 11.5–15.5)
WBC: 15.3 10*3/uL — ABNORMAL HIGH (ref 4.0–10.5)
nRBC: 0 % (ref 0.0–0.2)

## 2023-08-04 LAB — BASIC METABOLIC PANEL
Anion gap: 12 (ref 5–15)
BUN: 15 mg/dL (ref 8–23)
CO2: 22 mmol/L (ref 22–32)
Calcium: 8.8 mg/dL — ABNORMAL LOW (ref 8.9–10.3)
Chloride: 102 mmol/L (ref 98–111)
Creatinine, Ser: 1.13 mg/dL — ABNORMAL HIGH (ref 0.44–1.00)
GFR, Estimated: 50 mL/min — ABNORMAL LOW (ref >60)
Glucose, Bld: 121 mg/dL — ABNORMAL HIGH (ref 70–99)
Potassium: 4 mmol/L (ref 3.5–5.1)
Sodium: 136 mmol/L (ref 135–145)

## 2023-08-04 LAB — PROTIME-INR
INR: 1.1 (ref 0.8–1.2)
Prothrombin Time: 13.9 s (ref 11.4–15.2)

## 2023-08-04 MED ORDER — ONDANSETRON HCL 4 MG/2ML IJ SOLN
4.0000 mg | Freq: Once | INTRAMUSCULAR | Status: AC
  Administered 2023-08-04: 4 mg via INTRAVENOUS
  Filled 2023-08-04: qty 2

## 2023-08-04 MED ORDER — TRANEXAMIC ACID-NACL 1000-0.7 MG/100ML-% IV SOLN
1000.0000 mg | Freq: Once | INTRAVENOUS | Status: AC
  Administered 2023-08-04: 1000 mg via INTRAVENOUS
  Filled 2023-08-04: qty 100

## 2023-08-04 MED ORDER — ACETAMINOPHEN 325 MG PO TABS: 650.0000 mg | ORAL_TABLET | Freq: Four times a day (QID) | ORAL | Status: DC | PRN

## 2023-08-04 MED ORDER — ONDANSETRON HCL 4 MG PO TABS: 4.0000 mg | ORAL_TABLET | Freq: Four times a day (QID) | ORAL | Status: DC | PRN

## 2023-08-04 MED ORDER — ACETAMINOPHEN 650 MG RE SUPP: 650.0000 mg | Freq: Four times a day (QID) | RECTAL | Status: DC | PRN

## 2023-08-04 MED ORDER — SODIUM CHLORIDE 0.9 % IV SOLN: INTRAVENOUS | Status: DC

## 2023-08-04 MED ORDER — ONDANSETRON HCL 4 MG/2ML IJ SOLN: 4.0000 mg | Freq: Four times a day (QID) | INTRAMUSCULAR | Status: DC | PRN

## 2023-08-04 MED ORDER — MORPHINE SULFATE (PF) 4 MG/ML IV SOLN
4.0000 mg | Freq: Once | INTRAVENOUS | Status: AC
  Administered 2023-08-04: 4 mg via INTRAVENOUS
  Filled 2023-08-04: qty 1

## 2023-08-04 MED ORDER — CEFAZOLIN SODIUM-DEXTROSE 2-4 GM/100ML-% IV SOLN
2.0000 g | Freq: Once | INTRAVENOUS | Status: DC
  Filled 2023-08-04: qty 100

## 2023-08-04 MED ORDER — HYDRALAZINE HCL 20 MG/ML IJ SOLN
10.0000 mg | Freq: Four times a day (QID) | INTRAMUSCULAR | Status: DC | PRN
  Administered 2023-08-04: 10 mg via INTRAVENOUS
  Filled 2023-08-04 (×2): qty 1

## 2023-08-04 MED ORDER — MORPHINE SULFATE (PF) 2 MG/ML IV SOLN
2.0000 mg | INTRAVENOUS | Status: DC | PRN
  Administered 2023-08-04 – 2023-08-05 (×3): 2 mg via INTRAVENOUS
  Filled 2023-08-04 (×3): qty 1

## 2023-08-04 MED ORDER — LACTATED RINGERS IV SOLN: INTRAVENOUS | Status: DC

## 2023-08-04 NOTE — Progress Notes (Signed)
Full consult note and discussion with patient to follow tomorrow AM.  Called by ED staff. Imaging reviewed.  - Plan for surgery tomorrow, likely afternoon.  - NPO after midnight - Hold anticoagulation - Admit to Hospitalist team.

## 2023-08-04 NOTE — ED Notes (Signed)
EMS IV blew. Removed at this time.

## 2023-08-04 NOTE — H&P (Signed)
History and Physical    [name] ZOX:[id number] DOB: 03/17/1946 DOA: 08/04/2023 DOS: the patient was seen and examined on 08/04/2023 PCP: Ailene Ravel, MD  Patient coming from: Home  Chief Complaint:  Chief Complaint  Patient presents with   Hip Pain   HPI: Leah Mitchell is a 77 y.o. female with medical history significant of dementia, suspected hypertension but normal recorded who was brought in to the ER after sustaining a fall.  Patient apparently was doing fine when he she had a trip and fall landing on her left hip.  Started having left hip pain and inability to bear weight.  Was brought to the ER where she was found to have left femoral fracture.  Patient otherwise hemodynamically stable.  Orthopedics consulted and Dr. Allena Katz plans repair for hip fracture tomorrow.  Patient being admitted to our service for further evaluation and treatment.  Review of Systems: As mentioned in the history of present illness. All other systems reviewed and are negative. History reviewed. No pertinent past medical history. History reviewed. No pertinent surgical history. Social History:  has no history on file for tobacco use, alcohol use, and drug use.  Not on File  History reviewed. No pertinent family history.  Prior to Admission medications   Not on File    Physical Exam: Vitals:   08/04/23 1715 08/04/23 1719 08/04/23 1730 08/04/23 1800  BP: (!) 157/72 (!) 157/72 (!) 192/74 (!) 138/105  Pulse: 88 80 84 84  Resp:  (!) 25  20  SpO2: 100% 100% 100% 99%  Weight:      Height:       Constitutional: Acutely ill looking, no significant distress NAD, calm, comfortable Eyes: PERRL, lids and conjunctivae normal ENMT: Mucous membranes are moist. Posterior pharynx clear of any exudate or lesions.Normal dentition.  Neck: normal, supple, no masses, no thyromegaly Respiratory: clear to auscultation bilaterally, no wheezing, no crackles. Normal respiratory effort. No accessory muscle  use.  Cardiovascular: Regular rate and rhythm, no murmurs / rubs / gallops. No extremity edema. 2+ pedal pulses. No carotid bruits.  Abdomen: no tenderness, no masses palpated. No hepatosplenomegaly. Bowel sounds positive.  Musculoskeletal: Left lower extremity laterally rotated and shortened, no joint swelling or tenderness, Skin: no rashes, lesions, ulcers. No induration Neurologic: CN 2-12 grossly intact. Sensation intact, DTR normal. Strength 5/5 in all 4.  Psychiatric: Normal judgment and insight. Alert and oriented x 3. Normal mood  Data Reviewed:  Blood pressure 195/78 pulse 97 respiratory 25 oxygen sat 94% room air.  White count 15.3 creatinine 1.13 calcium 8.0 glucose 121. Chest x-ray showed no acute findings.  X-ray of the left hip showed comminuted displaced and angulated left proximal femur fracture intertrochanteric and soft trochanteric components  Assessment and Plan:  #1 left femoral neck fracture: Comminuted.  Secondary to mechanical fall.  Patient will be admitted.  Pain management.  Orthopedic consulted.  N.p.o. after midnight for possible surgical repair tomorrow.  #2 elevated blood pressure: Noted with demented history of hypertension but patient's blood pressure is markedly elevated.  Will initiate as needed blood pressure medications using hydralazine.  #3 status post mechanical fall: PT and OT consult after procedure.  #4 mild AKI: Will hydrate.  #5 leukocytosis: Possibly due to inflammation.  Monitor white count.    Advance Care Planning:   Code Status: Not on file full code  Consults: Dr. Signa Kell, orthopedics  Family Communication: No family at bedside  Severity of Illness: The appropriate patient status for this  patient is INPATIENT. Inpatient status is judged to be reasonable and necessary in order to provide the required intensity of service to ensure the patient's safety. The patient's presenting symptoms, physical exam findings, and initial  radiographic and laboratory data in the context of their chronic comorbidities is felt to place them at high risk for further clinical deterioration. Furthermore, it is not anticipated that the patient will be medically stable for discharge from the hospital within 2 midnights of admission.   * I certify that at the point of admission it is my clinical judgment that the patient will require inpatient hospital care spanning beyond 2 midnights from the point of admission due to high intensity of service, high risk for further deterioration and high frequency of surveillance required.*  AuthorLonia Blood, MD 08/04/2023 6:34 PM  For on call review [email or web address].

## 2023-08-04 NOTE — ED Triage Notes (Signed)
Pt in via [facility] for mechanical fall. Ems reports she landed on her left hip and shorting with outward rotation. Hx of dementia. Pt unable to answer questions.  20g in RAC Per EMS- Given 4 of zofran & of fentanyl.  EMS Vitals: Bp- 190/99 Hr-96 99% on RA RR- 19

## 2023-08-04 NOTE — ED Provider Notes (Signed)
[facility] Provider Note    Event Date/Time   First MD Initiated Contact with Patient 08/04/23 1603     (approximate)   History   Chief Complaint: Hip Pain   HPI  Leah Mitchell is a 77 y.o. female with a past history of dementia who was in her usual state of health, had a trip and fall landing on her left hip.  She complains of left hip pain and inability to bear weight.  Denies any other complaints.     Physical Exam   Triage Vital Signs: ED Triage Vitals  Encounter Vitals Group     BP      Systolic BP Percentile      Diastolic BP Percentile      Pulse      Resp      Temp      Temp src      SpO2      Weight      Height      Head Circumference      Peak Flow      Pain Score      Pain Loc      Pain Education      Exclude from Growth Chart     Most recent vital signs: Vitals:   08/04/23 1730 08/04/23 1800  BP: (!) 192/74 (!) 138/105  Pulse: 84 84  Resp:  20  SpO2: 100% 99%    General: Awake, no distress.  CV:  Good peripheral perfusion.  Regular rate and rhythm Resp:  Normal effort.  Clear to auscultation bilaterally Abd:  No distention.  Soft nontender Other:  Tenderness to the left hip.  Shortening of the left lower extremity with external rotation. No midline spinal tenderness.  No evidence of head trauma.   ED Results / Procedures / Treatments   Labs (all labs ordered are listed, but only abnormal results are displayed) Labs Reviewed  BASIC METABOLIC PANEL - Abnormal; Notable for the following components:      Result Value   Glucose, Bld 121 (*)    Creatinine, Ser 1.13 (*)    Calcium 8.8 (*)    GFR, Estimated 50 (*)    All other components within normal limits  CBC WITH DIFFERENTIAL/PLATELET - Abnormal; Notable for the following components:   WBC 15.3 (*)    Neutro Abs 12.4 (*)    Monocytes Absolute 1.1 (*)    All other components within normal limits  PROTIME-INR     EKG Interpreted by me Sinus rhythm  rate of 80.  Normal axis and intervals.  Normal QRS ST segments and T waves   RADIOLOGY X-ray left hip interpreted by me, shows an intertrochanteric left femur fracture with significant displacement.  Radiology report reviewed  CT head and cervical spine unremarkable.   PROCEDURES:  Procedures   MEDICATIONS ORDERED IN ED: Medications  lactated ringers infusion ( Intravenous New Bag/Given 08/04/23 1716)  morphine (PF) 4 MG/ML injection 4 mg (4 mg Intravenous Given 08/04/23 1713)  ondansetron (ZOFRAN) injection 4 mg (4 mg Intravenous Given 08/04/23 1713)  morphine (PF) 4 MG/ML injection 4 mg (4 mg Intravenous Given 08/04/23 1814)     IMPRESSION / MDM / ASSESSMENT AND PLAN / ED COURSE  I reviewed the triage vital signs and the nursing notes.  DDx: Hip fracture, pelvis fracture, anemia, AKI, electrolyte abnormality, intracranial hemorrhage, C-spine fracture  Patient's presentation is most consistent with acute presentation with potential threat to life or bodily function.  Patient presents with left hip pain after mechanical fall, high clinical suspicion for hip fracture.  Will obtain labs and x-rays.  Will need CT head and C-spine due to fall and potential head injury is an patient with dementia and clear distracting injury who is unable to provide reliable history   ----------------------------------------- 6:23 PM on 08/04/2023 ----------------------------------------- X-ray confirms a left hip fracture.  CT head and cervical spine unremarkable.  Case discussed with orthopedics will plan for surgical repair.  Discussed with hospitalist for further management.      FINAL CLINICAL IMPRESSION(S) / ED DIAGNOSES   Final diagnoses:  Closed comminuted intertrochanteric fracture of left femur, initial encounter (HCC)     Rx / DC Orders   ED Discharge Orders     None        Note:  This document was prepared using Dragon voice recognition software and may include unintentional  dictation errors.   Sharman Cheek, MD 08/04/23 [phone]

## 2023-08-04 NOTE — ED Notes (Signed)
Family at bedside. 

## 2023-08-05 ENCOUNTER — Inpatient Hospital Stay: Payer: Medicare Other

## 2023-08-05 DIAGNOSIS — E785 Hyperlipidemia, unspecified: Secondary | ICD-10-CM

## 2023-08-05 DIAGNOSIS — D72829 Elevated white blood cell count, unspecified: Secondary | ICD-10-CM | POA: Diagnosis not present

## 2023-08-05 DIAGNOSIS — F0390 Unspecified dementia without behavioral disturbance: Secondary | ICD-10-CM | POA: Diagnosis not present

## 2023-08-05 DIAGNOSIS — I10 Essential (primary) hypertension: Secondary | ICD-10-CM

## 2023-08-05 DIAGNOSIS — K219 Gastro-esophageal reflux disease without esophagitis: Secondary | ICD-10-CM

## 2023-08-05 DIAGNOSIS — S7222XA Displaced subtrochanteric fracture of left femur, initial encounter for closed fracture: Secondary | ICD-10-CM | POA: Diagnosis not present

## 2023-08-05 LAB — CBC
HCT: 34.9 % — ABNORMAL LOW (ref 36.0–46.0)
Hemoglobin: 11.6 g/dL — ABNORMAL LOW (ref 12.0–15.0)
MCH: 31.1 pg (ref 26.0–34.0)
MCHC: 33.2 g/dL (ref 30.0–36.0)
MCV: 93.6 fL (ref 80.0–100.0)
Platelets: 336 10*3/uL (ref 150–400)
RBC: 3.73 MIL/uL — ABNORMAL LOW (ref 3.87–5.11)
RDW: 13.2 % (ref 11.5–15.5)
WBC: 14.5 10*3/uL — ABNORMAL HIGH (ref 4.0–10.5)
nRBC: 0 % (ref 0.0–0.2)

## 2023-08-05 LAB — BASIC METABOLIC PANEL
Anion gap: 11 (ref 5–15)
BUN: 13 mg/dL (ref 8–23)
CO2: 22 mmol/L (ref 22–32)
Calcium: 8.7 mg/dL — ABNORMAL LOW (ref 8.9–10.3)
Chloride: 103 mmol/L (ref 98–111)
Creatinine, Ser: 0.78 mg/dL (ref 0.44–1.00)
GFR, Estimated: >60 mL/min (ref >60)
Glucose, Bld: 108 mg/dL — ABNORMAL HIGH (ref 70–99)
Potassium: 3.8 mmol/L (ref 3.5–5.1)
Sodium: 136 mmol/L (ref 135–145)

## 2023-08-05 MED ORDER — PANTOPRAZOLE SODIUM 40 MG PO TBEC
40.0000 mg | DELAYED_RELEASE_TABLET | Freq: Every day | ORAL | Status: DC
  Administered 2023-08-05 – 2023-08-11 (×6): 40 mg via ORAL
  Filled 2023-08-05 (×6): qty 1

## 2023-08-05 MED ORDER — LABETALOL HCL 5 MG/ML IV SOLN
10.0000 mg | INTRAVENOUS | Status: DC | PRN
  Administered 2023-08-05 (×2): 10 mg via INTRAVENOUS
  Filled 2023-08-05 (×2): qty 4

## 2023-08-05 MED ORDER — DONEPEZIL HCL 5 MG PO TABS
5.0000 mg | ORAL_TABLET | Freq: Every day | ORAL | Status: DC
  Administered 2023-08-07 – 2023-08-10 (×4): 5 mg via ORAL
  Filled 2023-08-05 (×6): qty 1

## 2023-08-05 MED ORDER — HYDROMORPHONE HCL 1 MG/ML IJ SOLN
1.0000 mg | INTRAMUSCULAR | Status: DC | PRN
  Administered 2023-08-05 – 2023-08-06 (×4): 1 mg via INTRAVENOUS
  Filled 2023-08-05 (×5): qty 1

## 2023-08-05 MED ORDER — ATORVASTATIN CALCIUM 10 MG PO TABS
10.0000 mg | ORAL_TABLET | Freq: Every day | ORAL | Status: DC
  Administered 2023-08-05 – 2023-08-11 (×6): 10 mg via ORAL
  Filled 2023-08-05 (×6): qty 1

## 2023-08-05 MED ORDER — AMLODIPINE BESYLATE 5 MG PO TABS
2.5000 mg | ORAL_TABLET | Freq: Every day | ORAL | Status: DC
  Administered 2023-08-05 – 2023-08-11 (×5): 2.5 mg via ORAL
  Filled 2023-08-05 (×6): qty 1

## 2023-08-05 NOTE — Assessment & Plan Note (Signed)
Continue home Aricept. Delirium precautions

## 2023-08-05 NOTE — Plan of Care (Signed)

## 2023-08-05 NOTE — Hospital Course (Addendum)
Taken from H&P.   Leah Mitchell is a 77 y.o. female with medical history significant of dementia, suspected hypertension but normal recorded who was brought in to the ER after sustaining a mechanical fall. She was found to have left femoral fracture. Patient otherwise hemodynamically stable. Orthopedics consulted and Dr. Allena Katz plans repair for hip fracture tomorrow.   9/5: Slight decrease in hemoglobin today.  Awaiting surgery.  Switched to Dilaudid as morphine was not very helpful for pain.  9/6: Surgery got postponed yesterday for today.  Further decrease in hemoglobin to 10.3, all cell lines decreased so some dilutional effect with IV fluid.  Having right knee pain, imaging with arthritis.  9/7: S/p ORIF on 08/06/2023, tolerated the procedure well.  She will be weightbearing as tolerated per orthopedic surgery.  Hemoglobin decreased to 8.1 postsurgically, increasing the dose of iron supplement to twice daily, patient had a vasovagal syncopal episode, feeling very weak and pale day-ordered 1 unit of PRBC.  9/8: Vital stable, unable to void after removal of Foley yesterday, Foley was replaced after doing 1 time in and out cath.  Hemoglobin at 7.8 from 8.1 even with 1 unit of PRBC, some bleeding from the surgical site.  Ordered 1 more unit.  Mild hypocalcemia-starting on p.o. supplement and checking vitamin D level. PT is recommending SNF.  9/9: Hemodynamically stable, no obvious bleeding but hemoglobin with some decreased to 8.2 after some improvement with a unit of blood.  Total received 2 units of PRBC so far. TOC is working on placement.  9/10: Foley catheter removed to give her a voiding trial.  Hemoglobin now stable .  Concern of left lower extremity edema-ordered venous Doppler.  Awaiting SNF placement .  9/11: Hemodynamically stable.  Able to void after removal of Foley catheter.  Left lower extremity venous Doppler was negative for DVT.  She was started on Xarelto as DVT prophylaxis which  she will take for 4 weeks.  Patient is being discharged to rehab for further management.  She will continue on current medications and follow-up with her providers for further recommendations.

## 2023-08-05 NOTE — Assessment & Plan Note (Signed)
Blood pressure mildly elevated, likely secondary to pain -Continue home amlodipine -As needed labetalol

## 2023-08-05 NOTE — Plan of Care (Signed)
  Problem: Education: Goal: Knowledge of General Education information will improve Description: Including pain rating scale, medication(s)/side effects and non-pharmacologic comfort measures Outcome: Progressing   Problem: Clinical Measurements: Goal: Ability to maintain clinical measurements within normal limits will improve Outcome: Progressing Goal: Will remain free from infection Outcome: Progressing   Problem: Nutrition: Goal: Adequate nutrition will be maintained Outcome: Progressing   Problem: Pain Managment: Goal: General experience of comfort will improve Outcome: Progressing   Problem: Safety: Goal: Ability to remain free from injury will improve Outcome: Progressing   Problem: Skin Integrity: Goal: Risk for impaired skin integrity will decrease Outcome: Progressing   

## 2023-08-05 NOTE — Assessment & Plan Note (Signed)
Likely reactive with this recent fracture and fall.  Mild improvement but all cell lines decreased. -Continue to monitor

## 2023-08-05 NOTE — Consult Note (Signed)
ORTHOPAEDIC CONSULTATION  REQUESTING PHYSICIAN: Arnetha Courser, MD  Chief Complaint:   L hip pain  History of Present Illness: Leah Mitchell is a 77 y.o. female who had a fall yesterday while at home.  The patient noted immediate L hip pain and inability to ambulate.  The patient ambulates unassisted at baseline.  The patient lives at home with her husband. Pain is worse with any sort of movement.  X-rays in the emergency department show a left reverse obliquity intertrochanteric hip fracture.  Of note, she endorses significant R leg pain at baseline and with attempted motion. She has difficulty describing which portion of the leg is painful.   She does have a history of dementia in the chart, but patient and husband deny any significant memory concerns.   History reviewed. No pertinent past medical history. History reviewed. No pertinent surgical history. Social History   Socioeconomic History   Marital status: Married    Spouse name: Not on file   Number of children: Not on file   Years of education: Not on file   Highest education level: Not on file  Occupational History   Not on file  Tobacco Use   Smoking status: Not on file   Smokeless tobacco: Not on file  Substance and Sexual Activity   Alcohol use: Not on file   Drug use: Not on file   Sexual activity: Not on file  Other Topics Concern   Not on file  Social History Narrative   Not on file   Social Determinants of Health   Financial Resource Strain: Not on file  Food Insecurity: Not on file  Transportation Needs: Not on file  Physical Activity: Not on file  Stress: Not on file  Social Connections: Not on file   History reviewed. No pertinent family history. Allergies  Allergen Reactions   Aspirin Other (See Comments)    Crohn's Disease   Prednisone Other (See Comments)    Crohn's Disease   Sulfa Antibiotics Other (See Comments)    Crohn's  Disease   Prior to Admission medications   Medication Sig Start Date End Date Taking? Authorizing Provider  amLODipine (NORVASC) 2.5 MG tablet Take 2.5 mg by mouth daily.   Yes [provider]  atorvastatin (LIPITOR) 10 MG tablet Take 10 mg by mouth daily.   Yes [provider]  donepezil (ARICEPT) 5 MG tablet Take 5 mg by mouth at bedtime.   Yes [provider]  omeprazole (PRILOSEC) 20 MG capsule Take 20 mg by mouth daily.   Yes [provider]   Recent Labs    08/04/23 1635 08/05/23 0334  WBC 15.3* 14.5*  HGB 12.4 11.6*  HCT 38.1 34.9*  PLT 342 336  K 4.0 3.8  CL 102 103  CO2 22 22  BUN 15 13  CREATININE 1.13* 0.78  GLUCOSE 121* 108*  CALCIUM 8.8* 8.7*  INR 1.1  --    DG Chest 1 View  Result Date: 08/04/2023 CLINICAL DATA:  Left hip pain after fall, fracture. EXAM: CHEST  1 VIEW COMPARISON:  04/23/2020 FINDINGS: The cardiomediastinal contours are normal. Subsegmental scarring at the left lung base. Pulmonary vasculature is normal. No consolidation, pleural effusion, or pneumothorax. No acute osseous abnormalities are seen. IMPRESSION: No acute findings.  Subsegmental scarring at the left lung base. Electronically Signed   By: Narda Rutherford M.D.   On: 08/04/2023 17:45   CT Cervical Spine Wo Contrast  Result Date: 08/04/2023 CLINICAL DATA:  Neck trauma (Age >=  65y) Dementia patient post fall.  Pain. EXAM: CT CERVICAL SPINE WITHOUT CONTRAST TECHNIQUE: Multidetector CT imaging of the cervical spine was performed without intravenous contrast. Multiplanar CT image reconstructions were also generated. RADIATION DOSE REDUCTION: This exam was performed according to the departmental dose-optimization program which includes automated exposure control, adjustment of the mA and/or kV according to patient size and/or use of iterative reconstruction technique. COMPARISON:  04/23/2020 FINDINGS: Alignment: No traumatic subluxation. Grade 1 degenerative  anterolisthesis of C3 on C4, C4 on C5, and C7 on T1. Skull base and vertebrae: No acute fracture. Vertebral body heights are maintained. The dens and skull base are intact. Incomplete fusion posterior arch of C1 on the left, unchanged. Soft tissues and spinal canal: No prevertebral fluid or swelling. No visible canal hematoma. Disc levels: Mild diffuse degenerative disc disease and facet hypertrophy. Upper chest: No acute findings. Other: None. IMPRESSION: Mild degenerative change in the cervical spine without acute fracture or subluxation. Electronically Signed   By: Narda Rutherford M.D.   On: 08/04/2023 17:44   CT Head Wo Contrast  Result Date: 08/04/2023 CLINICAL DATA:  Head trauma, minor (Age >= 65y) pain. EXAM: CT HEAD WITHOUT CONTRAST TECHNIQUE: Contiguous axial images were obtained from the base of the skull through the vertex without intravenous contrast. RADIATION DOSE REDUCTION: This exam was performed according to the departmental dose-optimization program which includes automated exposure control, adjustment of the mA and/or kV according to patient size and/or use of iterative reconstruction technique. COMPARISON:  Head CT 09/16/2020 FINDINGS: Brain: No acute intracranial hemorrhage. No subdural or extra-axial collection. No hydrocephalus. Age related atrophy. Moderate periventricular and deep chronic small vessel ischemia. 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 Remote lacunar infarcts in the basal ganglia. No midline shift or mass lesion/mass effect. Vascular: Atherosclerosis of skullbase vasculature without hyperdense vessel or abnormal calcification. Skull: No fracture or focal lesion. Sinuses/Orbits: No acute finding. Other: No large scalp hematoma. IMPRESSION: 1. No acute intracranial abnormality. No skull fracture. 2. Age related atrophy and chronic ischemic change. Electronically Signed   By: Narda Rutherford M.D.   On: 08/04/2023 17:40   DG Hip Unilat W or Wo Pelvis 2-3 Views  Left  Result Date: 08/04/2023 CLINICAL DATA:  Left hip pain after fall. Left lower extremity foreshortening. EXAM: DG HIP (WITH OR WITHOUT PELVIS) 2-3V LEFT COMPARISON:  None Available. FINDINGS: Comminuted displaced left proximal femur fracture. There is seen intertrochanteric and vertical subtrochanteric component extending laterally. Apex lateral angulation. The femoral head remains seated in the acetabulum. Intact pubic rami and bony pelvis. 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 No pubic symphyseal or sacroiliac joint diastasis. Symphyseal chondrocalcinosis. IMPRESSION: Comminuted displaced and angulated left proximal femur fracture, intertrochanteric and subtrochanteric components. Electronically Signed   By: Narda Rutherford M.D.   On: 08/04/2023 17:34     Positive ROS: All other systems have been reviewed and were otherwise negative with the exception of those mentioned in the HPI and as above.  Physical Exam: BP (!) 167/70   Pulse 92   Temp 98.6 F (37 C) (Oral)   Resp 18   Ht 5\' 8"  (1.727 m)   Wt 77.2 kg   SpO2 94%   BMI 25.88 kg/m  General:  Alert, no acute distress, oriented to name, place, and time Psychiatric:  Patient appears competent for consent with normal mood and affect    Orthopedic Exam:  LLE: + DF/PF/EHL SILT grossly over foot Foot wwp +Log roll/axial load  RLE: +DF/PF/EHL SILT grossly  over foot Foot wwp Significant pain to palpation and to movement of both ankle and knee   X-rays:  As above: L reverse obliquity intertrochanteric hip fracture  Assessment/Plan: Leah Mitchell is a 77 y.o. female with a L reverse obliquity intertrochanteric hip fracture   1. I discussed the various treatment options including both surgical and non-surgical management of the fracture with the patient and/or family (medical PoA). We discussed the high risk of perioperative complications due to patient's age and other co-morbidities. After discussion of risks, benefits,  and alternatives to surgery, the family and/or patient were in agreement to proceed with surgery.The goals of surgery would be to provide adequate pain relief and allow for mobilization. Plan for surgery is L hip cephalomedullary nailing tomorrow, 08/06/23. Initial plan was for surgery today, but given that there was no OR available this afternoon or evening, we agreed to postpone surgery until tomorrow.  2. NPO after midnight 3. Hold anticoagulation in advance of OR 4. Placed order for XR of R knee and R ankle.    Signa Kell   08/05/2023 6:53 PM

## 2023-08-05 NOTE — Assessment & Plan Note (Signed)
-  Continue home Lipitor 

## 2023-08-05 NOTE — Assessment & Plan Note (Signed)
Continue PPI ?

## 2023-08-05 NOTE — Progress Notes (Signed)
  Progress Note   [name] IEP:[id number] DOB: 18-Dec-1945 DOA: 08/04/2023     1 DOS: the patient was seen and examined on 08/05/2023   Brief hospital course: Taken from H&P.   Leah Mitchell is a 77 y.o. female with medical history significant of dementia, suspected hypertension but normal recorded who was brought in to the ER after sustaining a mechanical fall. She was found to have left femoral fracture. Patient otherwise hemodynamically stable. Orthopedics consulted and Dr. Allena Katz plans repair for hip fracture tomorrow.   9/5: Slight decrease in hemoglobin today.  Awaiting surgery.  Switched to Dilaudid as morphine was not very helpful for pain  Assessment and Plan: * Closed left subtrochanteric femur fracture (HCC) Secondary to mechanical fall.  Orthopedic was consulted and patient will be going to the OR later today. -Continue with supportive care and pain management  Leucocytosis Likely reactive with this recent fracture and fall.  Mild improvement but all cell lines decreased. -Continue to monitor  Essential hypertension Blood pressure mildly elevated, likely secondary to pain -Continue home amlodipine -As needed labetalol  Dementia without behavioral disturbance (HCC) -Continue home Aricept -Delirium precautions  Hyperlipidemia -Continue home Lipitor  GERD (gastroesophageal reflux disease) -Continue PPI   Subjective: Patient was having 9/10 right leg pain.  Morphine was not very helpful.  Physical Exam: Vitals:   08/05/23 0021 08/05/23 0746 08/05/23 0907 08/05/23 1526  BP: (!) 152/61 (!) 195/83 (!) 148/71 (!) 165/77  Pulse: (!) 104 (!) 109 86 86  Resp: 20 19  18   Temp: 98.1 F (36.7 C) 98.9 F (37.2 C)  98.6 F (37 C)  TempSrc: Oral Oral  Oral  SpO2: 93% 96%  94%  Weight:      Height:       General.  Frail elderly lady, in no acute distress. Pulmonary.  Lungs clear bilaterally, normal respiratory effort. CV.  Regular rate and rhythm, no JVD,  rub or murmur. Abdomen.  Soft, nontender, nondistended, BS positive. CNS.  Alert and oriented .  No focal neurologic deficit. Extremities.  No edema, no cyanosis, pulses intact and symmetrical. Psychiatry.  Judgment and insight appears normal.   Data Reviewed: Prior data reviewed  Family Communication: Discussed with husband at bedside  Disposition: Status is: Inpatient Remains inpatient appropriate because: Severity of illness  Planned Discharge Destination:  To be decided  Time spent: 45 minutes  This record has been created using Conservation officer, historic buildings. Errors have been sought and corrected,but may not always be located. Such creation errors do not reflect on the standard of care.   Author: Arnetha Courser, MD 08/05/2023 4:30 PM  For on call review [email or web address].

## 2023-08-05 NOTE — Assessment & Plan Note (Signed)
Secondary to mechanical fall.  Orthopedic was consulted and patient will be going to the OR later today. -Continue with supportive care and pain management

## 2023-08-06 ENCOUNTER — Encounter: Payer: Self-pay | Admitting: Internal Medicine

## 2023-08-06 ENCOUNTER — Encounter
Admission: EM | Disposition: A | Discharge status: Skilled Nursing Facility | Payer: Self-pay | Source: Home / Self Care | Attending: Internal Medicine

## 2023-08-06 ENCOUNTER — Other Ambulatory Visit: Payer: Self-pay

## 2023-08-06 ENCOUNTER — Inpatient Hospital Stay: Payer: Medicare Other | Admitting: Certified Registered"

## 2023-08-06 ENCOUNTER — Inpatient Hospital Stay: Payer: Medicare Other

## 2023-08-06 DIAGNOSIS — I10 Essential (primary) hypertension: Secondary | ICD-10-CM | POA: Diagnosis not present

## 2023-08-06 DIAGNOSIS — F0390 Unspecified dementia without behavioral disturbance: Secondary | ICD-10-CM | POA: Diagnosis not present

## 2023-08-06 DIAGNOSIS — S7222XA Displaced subtrochanteric fracture of left femur, initial encounter for closed fracture: Secondary | ICD-10-CM | POA: Diagnosis not present

## 2023-08-06 DIAGNOSIS — D72829 Elevated white blood cell count, unspecified: Secondary | ICD-10-CM | POA: Diagnosis not present

## 2023-08-06 HISTORY — PX: INTRAMEDULLARY (IM) NAIL INTERTROCHANTERIC: SHX5875

## 2023-08-06 LAB — CBC
HCT: 31.8 % — ABNORMAL LOW (ref 36.0–46.0)
Hemoglobin: 10.3 g/dL — ABNORMAL LOW (ref 12.0–15.0)
MCH: 30.7 pg (ref 26.0–34.0)
MCHC: 32.4 g/dL (ref 30.0–36.0)
MCV: 94.9 fL (ref 80.0–100.0)
Platelets: 267 10*3/uL (ref 150–400)
RBC: 3.35 MIL/uL — ABNORMAL LOW (ref 3.87–5.11)
RDW: 13.5 % (ref 11.5–15.5)
WBC: 11.7 10*3/uL — ABNORMAL HIGH (ref 4.0–10.5)
nRBC: 0 % (ref 0.0–0.2)

## 2023-08-06 LAB — BASIC METABOLIC PANEL
Anion gap: 7 (ref 5–15)
BUN: 14 mg/dL (ref 8–23)
CO2: 24 mmol/L (ref 22–32)
Calcium: 8.3 mg/dL — ABNORMAL LOW (ref 8.9–10.3)
Chloride: 104 mmol/L (ref 98–111)
Creatinine, Ser: 0.65 mg/dL (ref 0.44–1.00)
GFR, Estimated: >60 mL/min (ref >60)
Glucose, Bld: 99 mg/dL (ref 70–99)
Potassium: 4 mmol/L (ref 3.5–5.1)
Sodium: 135 mmol/L (ref 135–145)

## 2023-08-06 SURGERY — FIXATION, FRACTURE, INTERTROCHANTERIC, WITH INTRAMEDULLARY ROD
Anesthesia: General | Laterality: Left

## 2023-08-06 MED ORDER — HYDROMORPHONE HCL 1 MG/ML IJ SOLN
INTRAMUSCULAR | Status: AC
  Filled 2023-08-06: qty 1

## 2023-08-06 MED ORDER — METOCLOPRAMIDE HCL 5 MG PO TABS: 5.0000 mg | ORAL_TABLET | Freq: Three times a day (TID) | ORAL | Status: DC | PRN

## 2023-08-06 MED ORDER — 0.9 % SODIUM CHLORIDE (POUR BTL) OPTIME
TOPICAL | Status: DC | PRN
  Administered 2023-08-06: 500 mL

## 2023-08-06 MED ORDER — METOCLOPRAMIDE HCL 5 MG/ML IJ SOLN: 5.0000 mg | Freq: Three times a day (TID) | INTRAMUSCULAR | Status: DC | PRN

## 2023-08-06 MED ORDER — PROPOFOL 10 MG/ML IV BOLUS
INTRAVENOUS | Status: AC
  Filled 2023-08-06: qty 20

## 2023-08-06 MED ORDER — FENTANYL CITRATE (PF) 100 MCG/2ML IJ SOLN
INTRAMUSCULAR | Status: AC
  Filled 2023-08-06: qty 2

## 2023-08-06 MED ORDER — METHOCARBAMOL 500 MG PO TABS
500.0000 mg | ORAL_TABLET | Freq: Four times a day (QID) | ORAL | Status: DC | PRN
  Administered 2023-08-09 – 2023-08-11 (×5): 500 mg via ORAL
  Filled 2023-08-06 (×5): qty 1

## 2023-08-06 MED ORDER — ONDANSETRON HCL 4 MG/2ML IJ SOLN
INTRAMUSCULAR | Status: DC | PRN
  Administered 2023-08-06: 4 mg via INTRAVENOUS

## 2023-08-06 MED ORDER — HYDROMORPHONE HCL 1 MG/ML IJ SOLN: 0.2000 mg | INTRAMUSCULAR | Status: DC | PRN

## 2023-08-06 MED ORDER — FENTANYL CITRATE (PF) 100 MCG/2ML IJ SOLN
25.0000 ug | INTRAMUSCULAR | Status: DC | PRN
  Administered 2023-08-06: 25 ug via INTRAVENOUS

## 2023-08-06 MED ORDER — ROCURONIUM BROMIDE 100 MG/10ML IV SOLN
INTRAVENOUS | Status: DC | PRN
  Administered 2023-08-06 (×2): 10 mg via INTRAVENOUS
  Administered 2023-08-06: 50 mg via INTRAVENOUS
  Administered 2023-08-06 (×3): 10 mg via INTRAVENOUS

## 2023-08-06 MED ORDER — CEFAZOLIN SODIUM-DEXTROSE 2-4 GM/100ML-% IV SOLN
INTRAVENOUS | Status: AC
  Filled 2023-08-06: qty 100

## 2023-08-06 MED ORDER — FE FUM-VIT C-VIT B12-FA 460-60-0.01-1 MG PO CAPS
1.0000 | ORAL_CAPSULE | Freq: Every day | ORAL | Status: DC
  Filled 2023-08-06 (×2): qty 1

## 2023-08-06 MED ORDER — OXYCODONE HCL 5 MG PO TABS
5.0000 mg | ORAL_TABLET | ORAL | Status: DC | PRN
  Administered 2023-08-08 – 2023-08-10 (×5): 10 mg via ORAL
  Filled 2023-08-06 (×5): qty 2
  Filled 2023-08-06: qty 1

## 2023-08-06 MED ORDER — ONDANSETRON HCL 4 MG/2ML IJ SOLN: 4.0000 mg | Freq: Four times a day (QID) | INTRAMUSCULAR | Status: DC | PRN

## 2023-08-06 MED ORDER — CEFAZOLIN SODIUM-DEXTROSE 2-4 GM/100ML-% IV SOLN
2.0000 g | Freq: Four times a day (QID) | INTRAVENOUS | Status: AC
  Administered 2023-08-07 (×3): 2 g via INTRAVENOUS
  Filled 2023-08-06 (×3): qty 100

## 2023-08-06 MED ORDER — HYDRALAZINE HCL 20 MG/ML IJ SOLN
INTRAMUSCULAR | Status: AC
  Filled 2023-08-06: qty 1

## 2023-08-06 MED ORDER — FENTANYL CITRATE (PF) 100 MCG/2ML IJ SOLN
INTRAMUSCULAR | Status: DC | PRN
  Administered 2023-08-06 (×2): 50 ug via INTRAVENOUS

## 2023-08-06 MED ORDER — LACTATED RINGERS IV SOLN: INTRAVENOUS | Status: DC

## 2023-08-06 MED ORDER — FLEET ENEMA RE ENEM: 1.0000 | ENEMA | Freq: Once | RECTAL | Status: DC | PRN

## 2023-08-06 MED ORDER — BISACODYL 10 MG RE SUPP: 10.0000 mg | Freq: Every day | RECTAL | Status: DC | PRN

## 2023-08-06 MED ORDER — ONDANSETRON HCL 4 MG PO TABS: 4.0000 mg | ORAL_TABLET | Freq: Four times a day (QID) | ORAL | Status: DC | PRN

## 2023-08-06 MED ORDER — ONDANSETRON HCL 4 MG/2ML IJ SOLN
INTRAMUSCULAR | Status: AC
  Filled 2023-08-06: qty 2

## 2023-08-06 MED ORDER — GLYCOPYRROLATE 0.2 MG/ML IJ SOLN
INTRAMUSCULAR | Status: AC
  Filled 2023-08-06: qty 1

## 2023-08-06 MED ORDER — ACETAMINOPHEN 10 MG/ML IV SOLN
INTRAVENOUS | Status: DC | PRN
  Administered 2023-08-06: 1000 mg via INTRAVENOUS

## 2023-08-06 MED ORDER — ORAL CARE MOUTH RINSE: 15.0000 mL | Freq: Once | OROMUCOSAL | Status: AC

## 2023-08-06 MED ORDER — TRAMADOL HCL 50 MG PO TABS: 50.0000 mg | ORAL_TABLET | Freq: Four times a day (QID) | ORAL | Status: DC | PRN

## 2023-08-06 MED ORDER — CHLORHEXIDINE GLUCONATE 0.12 % MT SOLN
OROMUCOSAL | Status: AC
  Filled 2023-08-06: qty 15

## 2023-08-06 MED ORDER — PROPOFOL 1000 MG/100ML IV EMUL
INTRAVENOUS | Status: AC
  Filled 2023-08-06: qty 100

## 2023-08-06 MED ORDER — ACETAMINOPHEN 500 MG PO TABS
1000.0000 mg | ORAL_TABLET | Freq: Three times a day (TID) | ORAL | Status: DC
  Administered 2023-08-07 – 2023-08-11 (×14): 1000 mg via ORAL
  Filled 2023-08-06 (×14): qty 2

## 2023-08-06 MED ORDER — MIDAZOLAM HCL 2 MG/2ML IJ SOLN
INTRAMUSCULAR | Status: AC
  Filled 2023-08-06: qty 2

## 2023-08-06 MED ORDER — LIDOCAINE HCL (PF) 2 % IJ SOLN
INTRAMUSCULAR | Status: AC
  Filled 2023-08-06: qty 5

## 2023-08-06 MED ORDER — SODIUM CHLORIDE 0.9 % IV SOLN: INTRAVENOUS | Status: DC

## 2023-08-06 MED ORDER — PHENYLEPHRINE HCL (PRESSORS) 10 MG/ML IV SOLN
INTRAVENOUS | Status: DC | PRN
  Administered 2023-08-06: 80 ug via INTRAVENOUS

## 2023-08-06 MED ORDER — OXYCODONE HCL 5 MG/5ML PO SOLN: 5.0000 mg | Freq: Once | ORAL | Status: DC | PRN

## 2023-08-06 MED ORDER — KETOROLAC TROMETHAMINE 15 MG/ML IJ SOLN
7.5000 mg | Freq: Four times a day (QID) | INTRAMUSCULAR | Status: AC
  Administered 2023-08-07 (×4): 7.5 mg via INTRAVENOUS
  Filled 2023-08-06 (×4): qty 1

## 2023-08-06 MED ORDER — PROPOFOL 10 MG/ML IV BOLUS
INTRAVENOUS | Status: DC | PRN
  Administered 2023-08-06: 80 mg via INTRAVENOUS

## 2023-08-06 MED ORDER — ENOXAPARIN SODIUM 40 MG/0.4ML IJ SOSY
40.0000 mg | PREFILLED_SYRINGE | INTRAMUSCULAR | Status: DC
  Administered 2023-08-07 – 2023-08-10 (×4): 40 mg via SUBCUTANEOUS
  Filled 2023-08-06 (×4): qty 0.4

## 2023-08-06 MED ORDER — OXYCODONE HCL 5 MG PO TABS
2.5000 mg | ORAL_TABLET | ORAL | Status: DC | PRN
  Administered 2023-08-07 – 2023-08-10 (×4): 5 mg via ORAL
  Filled 2023-08-06 (×3): qty 1

## 2023-08-06 MED ORDER — DOCUSATE SODIUM 100 MG PO CAPS
100.0000 mg | ORAL_CAPSULE | Freq: Two times a day (BID) | ORAL | Status: DC
  Administered 2023-08-07 – 2023-08-11 (×8): 100 mg via ORAL
  Filled 2023-08-06 (×10): qty 1

## 2023-08-06 MED ORDER — SENNOSIDES-DOCUSATE SODIUM 8.6-50 MG PO TABS: 1.0000 | ORAL_TABLET | Freq: Every evening | ORAL | Status: DC | PRN

## 2023-08-06 MED ORDER — BUPIVACAINE LIPOSOME 1.3 % IJ SUSP
INTRAMUSCULAR | Status: DC | PRN
  Administered 2023-08-06: 50 mL via INTRAMUSCULAR

## 2023-08-06 MED ORDER — OXYCODONE HCL 5 MG PO TABS: 5.0000 mg | ORAL_TABLET | Freq: Once | ORAL | Status: DC | PRN

## 2023-08-06 MED ORDER — CHLORHEXIDINE GLUCONATE 0.12 % MT SOLN
15.0000 mL | Freq: Once | OROMUCOSAL | Status: AC
  Administered 2023-08-06: 15 mL via OROMUCOSAL

## 2023-08-06 MED ORDER — SUGAMMADEX SODIUM 200 MG/2ML IV SOLN
INTRAVENOUS | Status: DC | PRN
  Administered 2023-08-06: 200 mg via INTRAVENOUS

## 2023-08-06 MED ORDER — HYDRALAZINE HCL 20 MG/ML IJ SOLN: 5.0000 mg | Freq: Once | INTRAMUSCULAR | Status: DC

## 2023-08-06 MED ORDER — LIDOCAINE HCL (CARDIAC) PF 100 MG/5ML IV SOSY
PREFILLED_SYRINGE | INTRAVENOUS | Status: DC | PRN
  Administered 2023-08-06: 80 mg via INTRAVENOUS

## 2023-08-06 MED ORDER — METHOCARBAMOL 1000 MG/10ML IJ SOLN: 500.0000 mg | Freq: Four times a day (QID) | INTRAVENOUS | Status: DC | PRN

## 2023-08-06 MED ORDER — HYDROMORPHONE HCL 1 MG/ML IJ SOLN
INTRAMUSCULAR | Status: DC | PRN
Start: 2023-08-06 — End: 2023-08-06
  Administered 2023-08-06 (×2): .25 mg via INTRAVENOUS

## 2023-08-06 MED ORDER — CEFAZOLIN SODIUM-DEXTROSE 2-3 GM-%(50ML) IV SOLR
INTRAVENOUS | Status: DC | PRN
Start: 2023-08-06 — End: 2023-08-06
  Administered 2023-08-06: 2 g via INTRAVENOUS

## 2023-08-06 MED ORDER — PROPOFOL 500 MG/50ML IV EMUL
INTRAVENOUS | Status: DC | PRN
Start: 2023-08-06 — End: 2023-08-06
  Administered 2023-08-06: 25 ug/kg/min via INTRAVENOUS

## 2023-08-06 SURGICAL SUPPLY — 50 items
APL PRP STRL LF DISP 70% ISPRP (MISCELLANEOUS) ×1
BIT DRILL INTERTAN LAG SCREW (BIT) IMPLANT
BIT DRILL SHORT 4.0 (BIT) IMPLANT
BLADE SURG 15 STRL LF DISP TIS (BLADE) ×1 IMPLANT
BLADE SURG 15 STRL SS (BLADE) ×1
CABLE CERLAGE W/CRIMP 1.8 (Cable) IMPLANT
CHLORAPREP W/TINT 26 (MISCELLANEOUS) ×1 IMPLANT
DRAPE SHEET LG 3/4 BI-LAMINATE (DRAPES) ×1 IMPLANT
DRAPE STERI IOBAN 125X83 (DRAPES) IMPLANT
DRAPE SURG 17X11 SM STRL (DRAPES) ×2 IMPLANT
DRAPE U-SHAPE 47X51 STRL (DRAPES) ×2 IMPLANT
DRILL BIT SHORT 4.0 (BIT) ×2
DRSG OPSITE POSTOP 3X4 (GAUZE/BANDAGES/DRESSINGS) ×3 IMPLANT
DRSG OPSITE POSTOP 4X6 (GAUZE/BANDAGES/DRESSINGS) IMPLANT
DRSG OPSITE POSTOP 4X8 (GAUZE/BANDAGES/DRESSINGS) IMPLANT
DRSG XEROFORM 1X8 (GAUZE/BANDAGES/DRESSINGS) IMPLANT
ELECT REM PT RETURN 9FT ADLT (ELECTROSURGICAL) ×1
ELECTRODE REM PT RTRN 9FT ADLT (ELECTROSURGICAL) ×1 IMPLANT
GAUZE XEROFORM 4X4 STRL (GAUZE/BANDAGES/DRESSINGS) ×1 IMPLANT
GLOVE BIOGEL PI IND STRL 8 (GLOVE) ×1 IMPLANT
GLOVE SURG SYN 7.5 E (GLOVE) ×1 IMPLANT
GLOVE SURG SYN 7.5 PF PI (GLOVE) ×1 IMPLANT
GOWN STRL REUS W/ TWL LRG LVL3 (GOWN DISPOSABLE) ×1 IMPLANT
GOWN STRL REUS W/ TWL XL LVL3 (GOWN DISPOSABLE) ×1 IMPLANT
GOWN STRL REUS W/TWL LRG LVL3 (GOWN DISPOSABLE) ×1
GOWN STRL REUS W/TWL XL LVL3 (GOWN DISPOSABLE) ×1
GUIDE PIN 3.2X343 (PIN) ×2
GUIDE PIN 3.2X343MM (PIN) ×2
GUIDEROD BALL TIP 3.0X800 (ORTHOPEDIC DISPOSABLE SUPPLIES) IMPLANT
KIT PATIENT CARE HANA TABLE (KITS) ×1 IMPLANT
KIT TURNOVER CYSTO (KITS) ×1 IMPLANT
MANIFOLD NEPTUNE II (INSTRUMENTS) ×1 IMPLANT
MAT ABSORB FLUID 56X50 GRAY (MISCELLANEOUS) ×2 IMPLANT
NAIL TRIGEN LEFT 10X38-125 (Nail) IMPLANT
NDL HYPO 22X1.5 SAFETY MO (MISCELLANEOUS) ×1 IMPLANT
NEEDLE HYPO 22X1.5 SAFETY MO (MISCELLANEOUS) ×1 IMPLANT
NS IRRIG 500ML POUR BTL (IV SOLUTION) ×1 IMPLANT
PACK HIP COMPR (MISCELLANEOUS) ×1 IMPLANT
PIN GUIDE 3.2X343MM (PIN) IMPLANT
SCREW LAG COMPR KIT 90/85 (Screw) IMPLANT
SCREW TRIGEN LOW PROF 5.0X42.5 (Screw) IMPLANT
SCREW TRIGEN LOW PROF 5.0X45 (Screw) IMPLANT
STAPLER SKIN PROX 35W (STAPLE) ×1 IMPLANT
SUT VIC AB 2-0 CT2 27 (SUTURE) ×1 IMPLANT
SYR 10ML LL (SYRINGE) ×1 IMPLANT
SYR 30ML LL (SYRINGE) ×1 IMPLANT
TAPE CLOTH 3X10 WHT NS LF (GAUZE/BANDAGES/DRESSINGS) ×2 IMPLANT
TRAP FLUID SMOKE EVACUATOR (MISCELLANEOUS) ×1 IMPLANT
WATER STERILE IRR 500ML POUR (IV SOLUTION) ×1 IMPLANT
ball tip guide rod 3.0mm x 800mm IMPLANT

## 2023-08-06 NOTE — Anesthesia Procedure Notes (Signed)
Procedure Name: Intubation Date/Time: 08/06/2023 2:48 PM  Performed by: Stephanie Coup, MDPre-anesthesia Checklist: Patient identified, Emergency Drugs available, Suction available and Patient being monitored Patient Re-evaluated:Patient Re-evaluated prior to induction Oxygen Delivery Method: Circle system utilized Preoxygenation: Pre-oxygenation with 100% oxygen Induction Type: IV induction Ventilation: Mask ventilation without difficulty Laryngoscope Size: McGraph and 3 Grade View: Grade I Tube type: Oral Tube size: 7.0 mm Number of attempts: 1 Airway Equipment and Method: Stylet and Oral airway Placement Confirmation: ETT inserted through vocal cords under direct vision, positive ETCO2 and breath sounds checked- equal and bilateral Secured at: 22 cm Tube secured with: Tape Dental Injury: Teeth and Oropharynx as per pre-operative assessment  Comments: Atraumatic intubation. Leah Mitchell, SRNA placed ETT under direct supervision. MDA and CRNA at bedside.

## 2023-08-06 NOTE — Progress Notes (Signed)
  Progress Note   [name] ZOX:[id number] DOB: 08-11-1946 DOA: 08/04/2023     2 DOS: the patient was seen and examined on 08/06/2023   Brief hospital course: Taken from H&P.   Tikeya Vaux is a 77 y.o. female with medical history significant of dementia, suspected hypertension but normal recorded who was brought in to the ER after sustaining a mechanical fall. She was found to have left femoral fracture. Patient otherwise hemodynamically stable. Orthopedics consulted and Dr. Allena Katz plans repair for hip fracture tomorrow.   9/5: Slight decrease in hemoglobin today.  Awaiting surgery.  Switched to Dilaudid as morphine was not very helpful for pain.  9/6: Surgery got postponed yesterday for today.  Further decrease in hemoglobin to 10.3, all cell lines decreased so some dilutional effect with IV fluid.  Having right knee pain, imaging with arthritis.  Assessment and Plan: * Closed left subtrochanteric femur fracture (HCC) Secondary to mechanical fall.  Orthopedic was consulted and patient will be going to the OR later today. -Continue with supportive care and pain management  Leucocytosis Likely reactive with this recent fracture and fall.  Slowly improving -Continue to monitor  Essential hypertension Blood pressure mildly elevated, likely secondary to pain -Continue home amlodipine -As needed labetalol  Dementia without behavioral disturbance (HCC) -Continue home Aricept -Delirium precautions  Hyperlipidemia -Continue home Lipitor  GERD (gastroesophageal reflux disease) -Continue PPI   Subjective: Pain seems better today.  Patient was somnolent after getting pain medication, awaiting surgery.  Physical Exam: Vitals:   08/05/23 1645 08/05/23 2331 08/06/23 0838 08/06/23 1049  BP: (!) 167/70 (!) 141/63 (!) 160/68 (!) 149/64  Pulse: 92 87 89 93  Resp:  17 16   Temp:  98.5 F (36.9 C) 97.9 F (36.6 C)   TempSrc:   Oral   SpO2:  92% 92%   Weight:      Height:        General.  Frail elderly lady, in no acute distress. Pulmonary.  Lungs clear bilaterally, normal respiratory effort. CV.  Regular rate and rhythm, no JVD, rub or murmur. Abdomen.  Soft, nontender, nondistended, BS positive. CNS.  Somnolent but arousable.  No focal neurologic deficit. Extremities.  No edema, no cyanosis, pulses intact and symmetrical. Psychiatry.  Judgment and insight appears normal.    Data Reviewed: Prior data reviewed  Family Communication: Discussed with husband at bedside  Disposition: Status is: Inpatient Remains inpatient appropriate because: Severity of illness  Planned Discharge Destination:  To be decided  Time spent: 44 minutes  This record has been created using Conservation officer, historic buildings. Errors have been sought and corrected,but may not always be located. Such creation errors do not reflect on the standard of care.   Author: Arnetha Courser, MD 08/06/2023 1:30 PM  For on call review [email or web address].

## 2023-08-06 NOTE — Assessment & Plan Note (Signed)
Secondary to mechanical fall.  S/p ORIF on 08/06/2023 -Continue with supportive care and pain management -PT and OT evaluation-recommending SNF

## 2023-08-06 NOTE — Assessment & Plan Note (Signed)
Likely reactive with this recent fracture and fall.  Slowly improving -Continue to monitor

## 2023-08-06 NOTE — Op Note (Signed)
DATE OF SURGERY: 08/06/2023  PREOPERATIVE DIAGNOSIS: Left reverse obliquity intertrochanteric hip fracture  POSTOPERATIVE DIAGNOSIS: Left reverse obliquityintertrochanteric hip fracture  PROCEDURE: Intramedullary nailing of Left femur with cephalomedullary device  SURGEON: Rosealee Albee, MD  ANESTHESIA: Gen  EBL: 250 cc  IVF: per anesthesia record  COMPONENTS:  [employer] Trigen Intertan Long Nail: 10x344mm; 90mm lag screw with 85mm compression screw; 5 x 42.36mm and 5 x 45mm distal cortical interlocking screws  INDICATIONS: Leah Mitchell is a 77 y.o. female who sustained a reverse obliquity intertrochanteric fracture after a fall. Risks and benefits of intramedullary nailing were explained to the patient and/or family . Risks include but are not limited to bleeding, infection, injury to tissues, nerves, vessels, nonunion/malunion, hardware failure, limb length discrepancy/hip rotation mismatch and risks of anesthesia. The patient and/or family understand these risks, have completed an informed consent, and wish to proceed.   PROCEDURE:  The patient was brought into the operating room. After administering anesthesia, the patient was placed in the supine position on the Hana table. The uninjured leg was placed in an extended position while the injured lower extremity was placed in longitudinal traction. The fracture was unable to be adequately reduced using longitudinal traction and rotation. The lateral aspect of the hip and thigh were prepped with ChloraPrep solution before being draped sterilely. Preoperative IV antibiotics were administered. A timeout was performed to verify the appropriate surgical site, patient, and procedure.    A lateral femoral incision was made. Dissection was carried down to the IT band, and it was incised. The vastus was split bluntly. Multiple reduction maneuvers were attempted. The leg was placed in slight external rotation, flexion, and adduction. A  percutanous anterior incision was used and a ball-spike pusher was used to the get the proximal fragment out of flexion. A bone hook and Hohmann elevator were used to pull lateral and anterior directed force on the femoral shaft fragment. Next, a colinear clamp was used to hold the fragments in a reduced position. There was still some mild flexion of the proximal fragment.   Next, the greater trochanter was identified and an approximately 6 cm incision was made about 3 fingerbreadths above the tip of the greater trochanter. The incision was carried down through the subcutaneous tissues to expose the gluteal fascia. This was split the length of the incision, providing access to the tip of the trochanter. Under fluoroscopic guidance, a guidewire was drilled starting just medial to the tip of the trochanter into the proximal metaphysis to the level of the lesser trochanter. After verifying its position fluoroscopically in AP and lateral projections, it was overreamed with the opening reamer to the level of the lesser trochanter. A guidewire was passed down through the femoral canal to the supracondylar region. The guidewire was overreamed sequentially using the flexible reamers. The nail was selected and advanced to the appropriate depth as verified fluoroscopically.    The guide system for the lag screw was positioned and advanced through an approximately 5cm incision over the lateral aspect of the proximal femur. The guidewire was drilled up through the femoral nail and into the femoral neck to rest within 5 mm of subchondral bone. After verifying its position in the femoral neck and head in both AP and lateral projections, the guidewire was measured and appropriate sized lag screw was selected.  The channel for the compression screw was drilled and antirotation bar was placed.  Lag screw was drilled and placed in appropriate position.  Compression screw  was then placed.  Appropriate compression was achieved.   The set screw was locked in place. Again, the adequacy of hardware position and fracture reduction was verified fluoroscopically in AP and lateral projections.   Next, a cerclage cable was placed around the fracture just distal to the level of the cephalomedullary screws to further reduce the fracture. This was done using a Zimmer cable and tensioning system.   Attention was directed distally. Using the "perfect circle" technique, the leg and fluoroscopy machine were positioned appropriately. A 2cm stab incision was made over the skin and IT band at the appropriate point before the drill bit was advanced through the cortex and across the static hole of the nail. Appropriate screw length was determined with a measuring guide. A distal interlocking screw was placed. This was repeated for a second distal interlocking screw through the dynamic hole. Again, the adequacy of screw positions was verified fluoroscopically in AP and lateral projections.   The wounds were irrigated thoroughly with sterile saline solution. Local anesthetic was injected into the wounds. Deep fascia/IT band was closed with 0-Vicryl. The subcutaneous tissues were closed using 2-0 Vicryl interrupted sutures. The skin was closed using staples. Sterile occlusive dressings were applied to all wounds. The patient was then transferred to the recovery room in satisfactory condition.   This case had additional complexity compared to standard intertrochanteric fracture and cephalomedullary nailing due to the fracture pattern (reverse obliquity) leading to more instability. A larger incision with more dissection had to be made to allow for more appropriate reduction. This surgery took ~90 minutes longer than usual cephalomedullary nailing, due to the fracture pattern.   POSTOPERATIVE PLAN: The patient will be WBAT on the operative extremity. Lovenox 40mg /day x 4 weeks to start on POD#1. Perioperative IV antibiotics x 24 hours. PT/OT on POD#1.

## 2023-08-06 NOTE — Transfer of Care (Signed)
Immediate Anesthesia Transfer of Care Note  [name]  Procedure(s) Performed: INTRAMEDULLARY (IM) NAIL INTERTROCHANTERIC (Left)  Patient Location: PACU  Anesthesia Type:General  Level of Consciousness: sedated  Airway & Oxygen Therapy: Patient Spontanous Breathing and Patient connected to face mask oxygen  Post-op Assessment: Report given to RN  Post vital signs: Reviewed and stable  Last Vitals:  Vitals Value Taken Time  BP 160/73 08/06/23 1807  Temp    Pulse 92 08/06/23 1814  Resp 19 08/06/23 1814  SpO2 98 % 08/06/23 1814  Vitals shown include unfiled device data.  Last Pain:  Vitals:   08/06/23 1331  TempSrc: Temporal  PainSc:          Complications: No notable events documented.

## 2023-08-06 NOTE — Care Management Important Message (Signed)
Important Message  Patient Details  Name: Leah Mitchell MRN: [id number] Date of Birth: Jul 10, 1946   Medicare Important Message Given:  N/A - LOS <3 / Initial given by admissions     [name] [name] 08/06/2023, 9:17 AM

## 2023-08-06 NOTE — Anesthesia Postprocedure Evaluation (Signed)
Anesthesia Post Note  Patient: Product/process development scientist  Procedure(s) Performed: INTRAMEDULLARY (IM) NAIL INTERTROCHANTERIC (Left)  Patient location during evaluation: PACU Anesthesia Type: General Level of consciousness: awake and alert Pain management: pain level controlled Vital Signs Assessment: post-procedure vital signs reviewed and stable Respiratory status: spontaneous breathing, nonlabored ventilation, respiratory function stable and patient connected to nasal cannula oxygen Cardiovascular status: blood pressure returned to baseline and stable Postop Assessment: no apparent nausea or vomiting Anesthetic complications: no   No notable events documented.   Last Vitals:  Vitals:   08/06/23 1930 08/06/23 1957  BP: (!) 156/65 (!) 143/61  Pulse: 94 99  Resp: 13 20  Temp: (!) 36.3 C 37.2 C  SpO2: 98% 96%    Last Pain:  Vitals:   08/06/23 1331  TempSrc: Temporal  PainSc:                  Cleda Mccreedy Madhuri Vacca

## 2023-08-06 NOTE — Plan of Care (Signed)
  Problem: Clinical Measurements: Goal: Will remain free from infection Outcome: Progressing   Problem: Nutrition: Goal: Adequate nutrition will be maintained Outcome: Progressing   Problem: Coping: Goal: Level of anxiety will decrease Outcome: Progressing   Problem: Pain Managment: Goal: General experience of comfort will improve Outcome: Progressing

## 2023-08-06 NOTE — H&P (Signed)
H&P reviewed. No significant changes noted.  

## 2023-08-06 NOTE — TOC Progression Note (Signed)
Transition of Care [facility]) - Progression Note    Patient Details  Name: Lillienne Ingemi MRN: [id number] Date of Birth: [birth year]  Transition of Care [facility]) CM/SW Contact  Marlowe Sax, RN Phone Number: 08/06/2023, 10:04 AM  Clinical Narrative:     TOC continues to follow surgery cancelled yesterday will be done today       Expected Discharge Plan and Services                                               Social Determinants of Health (SDOH) Interventions    Readmission Risk Interventions     No data to display

## 2023-08-06 NOTE — Anesthesia Preprocedure Evaluation (Signed)
Anesthesia Evaluation  Patient identified by MRN, date of birth, ID band Patient awake    Reviewed: Allergy & Precautions, NPO status , Patient's Chart, lab work & pertinent test results  Airway Mallampati: III  TM Distance: >3 FB Neck ROM: full    Dental  (+) Chipped, Dental Advidsory Given   Pulmonary neg pulmonary ROS   Pulmonary exam normal        Cardiovascular hypertension, On Medications Normal cardiovascular exam     Neuro/Psych  PSYCHIATRIC DISORDERS     Dementia negative neurological ROS     GI/Hepatic Neg liver ROS,GERD  Medicated,,  Endo/Other  negative endocrine ROS    Renal/GU      Musculoskeletal   Abdominal   Peds  Hematology negative hematology ROS (+)   Anesthesia Other Findings History reviewed. No pertinent past medical history.  History reviewed. No pertinent surgical history.  BMI    Body Mass Index: 25.88 kg/m      Reproductive/Obstetrics negative OB ROS                             Anesthesia Physical Anesthesia Plan  ASA: 3  Anesthesia Plan: General ETT and General   Post-op Pain Management:    Induction: Intravenous  PONV Risk Score and Plan: Dexamethasone, Ondansetron, Midazolam and Treatment may vary due to age or medical condition  Airway Management Planned: Oral ETT  Additional Equipment:   Intra-op Plan:   Post-operative Plan: Extubation in OR  Informed Consent: I have reviewed the patients History and Physical, chart, labs and discussed the procedure including the risks, benefits and alternatives for the proposed anesthesia with the patient or authorized representative who has indicated his/her understanding and acceptance.     Dental Advisory Given  Plan Discussed with: Anesthesiologist, CRNA and Surgeon  Anesthesia Plan Comments: (Patient consented for risks of anesthesia including but not limited to:  - adverse reactions to  medications - damage to eyes, teeth, lips or other oral mucosa - nerve damage due to positioning  - sore throat or hoarseness - Damage to heart, brain, nerves, lungs, other parts of body or loss of life  Patient voiced understanding.)        Anesthesia Quick Evaluation

## 2023-08-07 DIAGNOSIS — D72829 Elevated white blood cell count, unspecified: Secondary | ICD-10-CM | POA: Diagnosis not present

## 2023-08-07 DIAGNOSIS — F0390 Unspecified dementia without behavioral disturbance: Secondary | ICD-10-CM | POA: Diagnosis not present

## 2023-08-07 DIAGNOSIS — I10 Essential (primary) hypertension: Secondary | ICD-10-CM | POA: Diagnosis not present

## 2023-08-07 DIAGNOSIS — D62 Acute posthemorrhagic anemia: Secondary | ICD-10-CM | POA: Diagnosis present

## 2023-08-07 DIAGNOSIS — S7222XA Displaced subtrochanteric fracture of left femur, initial encounter for closed fracture: Secondary | ICD-10-CM | POA: Diagnosis not present

## 2023-08-07 LAB — BASIC METABOLIC PANEL
Anion gap: 7 (ref 5–15)
BUN: 17 mg/dL (ref 8–23)
CO2: 25 mmol/L (ref 22–32)
Calcium: 7.8 mg/dL — ABNORMAL LOW (ref 8.9–10.3)
Chloride: 103 mmol/L (ref 98–111)
Creatinine, Ser: 0.95 mg/dL (ref 0.44–1.00)
GFR, Estimated: >60 mL/min (ref >60)
Glucose, Bld: 103 mg/dL — ABNORMAL HIGH (ref 70–99)
Potassium: 4.1 mmol/L (ref 3.5–5.1)
Sodium: 135 mmol/L (ref 135–145)

## 2023-08-07 LAB — CBC
HCT: 24.5 % — ABNORMAL LOW (ref 36.0–46.0)
Hemoglobin: 8.1 g/dL — ABNORMAL LOW (ref 12.0–15.0)
MCH: 30.9 pg (ref 26.0–34.0)
MCHC: 33.1 g/dL (ref 30.0–36.0)
MCV: 93.5 fL (ref 80.0–100.0)
Platelets: 199 10*3/uL (ref 150–400)
RBC: 2.62 MIL/uL — ABNORMAL LOW (ref 3.87–5.11)
RDW: 13.3 % (ref 11.5–15.5)
WBC: 11.1 10*3/uL — ABNORMAL HIGH (ref 4.0–10.5)
nRBC: 0 % (ref 0.0–0.2)

## 2023-08-07 LAB — GLUCOSE, CAPILLARY: Glucose-Capillary: 89 mg/dL (ref 70–99)

## 2023-08-07 LAB — PREPARE RBC (CROSSMATCH)

## 2023-08-07 LAB — ABO/RH: ABO/RH(D): O NEG

## 2023-08-07 MED ORDER — OXYCODONE HCL 5 MG PO TABS: 2.5000 mg | ORAL_TABLET | ORAL | 0 refills | Status: AC | PRN

## 2023-08-07 MED ORDER — LIDOCAINE 5 % EX PTCH: 1.0000 | MEDICATED_PATCH | CUTANEOUS | Status: DC

## 2023-08-07 MED ORDER — FE FUM-VIT C-VIT B12-FA 460-60-0.01-1 MG PO CAPS
1.0000 | ORAL_CAPSULE | Freq: Two times a day (BID) | ORAL | Status: DC
  Administered 2023-08-07 – 2023-08-11 (×7): 1 via ORAL
  Filled 2023-08-07 (×10): qty 1

## 2023-08-07 MED ORDER — TRAMADOL HCL 50 MG PO TABS: 50.0000 mg | ORAL_TABLET | Freq: Four times a day (QID) | ORAL | 0 refills | Status: AC | PRN

## 2023-08-07 MED ORDER — LIDOCAINE 5 % EX PTCH
1.0000 | MEDICATED_PATCH | CUTANEOUS | Status: DC
  Administered 2023-08-07 – 2023-08-11 (×5): 1 via TRANSDERMAL
  Filled 2023-08-07 (×5): qty 1

## 2023-08-07 MED ORDER — SODIUM CHLORIDE 0.9 % IV BOLUS
500.0000 mL | Freq: Once | INTRAVENOUS | Status: AC
  Administered 2023-08-07: 500 mL via INTRAVENOUS

## 2023-08-07 MED ORDER — SODIUM CHLORIDE 0.9% IV SOLUTION: Freq: Once | INTRAVENOUS | Status: AC

## 2023-08-07 MED ORDER — ENOXAPARIN SODIUM 40 MG/0.4ML IJ SOSY: 40.0000 mg | PREFILLED_SYRINGE | INTRAMUSCULAR | 0 refills | Status: DC

## 2023-08-07 NOTE — Assessment & Plan Note (Signed)
Patient had a positive orthostatic vitals today so holding home amlodipine. -Continue home amlodipine from tomorrow -As needed labetalol

## 2023-08-07 NOTE — Progress Notes (Signed)
OT Cancellation Note  Patient Details Name: Sally-Ann Slate MRN: [id number] DOB: 28-Aug-1946   Cancelled Treatment:    Reason Eval/Treat Not Completed: Medical issues which prohibited therapy. Pt experienced an episode of decreased responsiveness with transfer attempt this AM, then a few hours later a syncopal event with rapid respond called. Hold OT evaluation at this time; will attempt at a later date as pt is stable and medically appropriate.  Latina Craver 08/07/2023, 1:34 PM

## 2023-08-07 NOTE — Significant Event (Signed)
Rapid Response Event Note   Reason for Call : called RRT for suspected syncopal event on bed side commode.   Initial Focused Assessment: pt back to bed, able to talk, answer questions, skin palor, see flowsheets for VS.      Interventions: Dr Nelson Chimes to bedside to assess pt, 500 ml NS bolus ordered, and pt to receive 1 unit prbc's.   Plan of Care: as above, Katrina LPN to call for further assistance.    Event Summary:   MD NotifiedNelson Chimes 1153 Call Time:1147 Arrival Time:1151 End Time:1200  Avon Mergenthaler A, RN

## 2023-08-07 NOTE — Progress Notes (Signed)
Progress Note   [name] JXB:[id number] DOB: 10/01/1946 DOA: 08/04/2023     3 DOS: the patient was seen and examined on 08/07/2023   Brief hospital course: Taken from H&P.   Janiah Cly is a 77 y.o. female with medical history significant of dementia, suspected hypertension but normal recorded who was brought in to the ER after sustaining a mechanical fall. She was found to have left femoral fracture. Patient otherwise hemodynamically stable. Orthopedics consulted and Dr. Allena Katz plans repair for hip fracture tomorrow.   9/5: Slight decrease in hemoglobin today.  Awaiting surgery.  Switched to Dilaudid as morphine was not very helpful for pain.  9/6: Surgery got postponed yesterday for today.  Further decrease in hemoglobin to 10.3, all cell lines decreased so some dilutional effect with IV fluid.  Having right knee pain, imaging with arthritis.  9/7: S/p ORIF on 08/06/2023, tolerated the procedure well.  She will be weightbearing as tolerated per orthopedic surgery.  Hemoglobin decreased to 8.1 postsurgically, increasing the dose of iron supplement to twice daily, patient had a vasovagal syncopal episode, feeling very weak and pale day-ordered 1 unit of PRBC.  Assessment and Plan: * Closed left subtrochanteric femur fracture (HCC) Secondary to mechanical fall.  S/p ORIF on 08/06/2023 -Continue with supportive care and pain management -PT and OT evaluation  Acute postoperative anemia due to expected blood loss Hemoglobin decreased to 8.1 from 12 on admission.  Likely secondary to bleeding with fracture and then during surgery. Patient is becoming symptomatic, appears pale and had a vasovagal episode. Positive orthostatic vitals. -Ordered 1 unit of PRBC -Increasing the dose of iron supplement  Leucocytosis Likely reactive with this recent fracture and fall.  Slowly improving -Continue to monitor  Essential hypertension Patient had a positive orthostatic vitals today so  holding home amlodipine. -Continue home amlodipine from tomorrow -As needed labetalol  Dementia without behavioral disturbance (HCC) -Continue home Aricept -Delirium precautions  Hyperlipidemia -Continue home Lipitor  GERD (gastroesophageal reflux disease) -Continue PPI   Subjective: Patient was feeling more weak and tired.  Pain seems controlled while sitting down.  Physical Exam: Vitals:   08/07/23 1000 08/07/23 1205 08/07/23 1227 08/07/23 1314  BP: (!) 104/47 (!) 128/52 (!) 124/49 (!) 122/55  Pulse:  89 86 86  Resp:  19 18 14   Temp:  98.5 F (36.9 C) (!) 97.5 F (36.4 C) 98.1 F (36.7 C)  TempSrc:      SpO2:  100% 93% 95%  Weight:      Height:       General.  Frail elderly lady in no acute distress.  Appears very pale. Pulmonary.  Lungs clear bilaterally, normal respiratory effort. CV.  Regular rate and rhythm, no JVD, rub or murmur. Abdomen.  Soft, nontender, nondistended, BS positive. CNS.  Alert and oriented .  No focal neurologic deficit. Extremities.  No edema, no cyanosis, pulses intact and symmetrical.  Blood soiled dressing on left hip Psychiatry.  Judgment and insight appears normal.    Data Reviewed: Prior data reviewed  Family Communication: Discussed with husband at bedside  Disposition: Status is: Inpatient Remains inpatient appropriate because: Severity of illness  Planned Discharge Destination:  To be decided  Time spent: 45 minutes  This record has been created using Conservation officer, historic buildings. Errors have been sought and corrected,but may not always be located. Such creation errors do not reflect on the standard of care.   Author: Arnetha Courser, MD 08/07/2023 2:06 PM  For on call review  [email or web address]

## 2023-08-07 NOTE — Progress Notes (Signed)
  Subjective: 1 Day Post-Op Procedure(s) (LRB): INTRAMEDULLARY (IM) NAIL INTERTROCHANTERIC (Left) Patient reports pain as mild.   Patient is well, and has had no acute complaints or problems Plan is to go Rehab after hospital stay. Negative for chest pain and shortness of breath Fever: no Gastrointestinal: Negative for nausea and vomiting  Objective: Vital signs in last 24 hours: Temp:  [97.4 F (36.3 C)-99.4 F (37.4 C)] 98.5 F (36.9 C) (09/07 0432) Pulse Rate:  [89-99] 90 (09/07 0432) Resp:  [13-20] 18 (09/07 0432) BP: (136-162)/(57-86) 136/57 (09/07 0432) SpO2:  [92 %-99 %] 97 % (09/07 0432)  Intake/Output from previous day:  Intake/Output Summary (Last 24 hours) at 08/07/2023 0615 Last data filed at 08/06/2023 1931 Gross per 24 hour  Intake 3370 ml  Output 2120 ml  Net 1250 ml    Intake/Output this shift: Total I/O In: 260 [P.O.:60; I.V.:200] Out: 70 [Urine:70]  Labs: Recent Labs    08/04/23 1635 08/05/23 0334 08/06/23 0407 08/07/23 0530  HGB 12.4 11.6* 10.3* 8.1*   Recent Labs    08/06/23 0407 08/07/23 0530  WBC 11.7* 11.1*  RBC 3.35* 2.62*  HCT 31.8* 24.5*  PLT 267 199   Recent Labs    08/06/23 0407 08/07/23 0530  NA 135 135  K 4.0 4.1  CL 104 103  CO2 24 25  BUN 14 17  CREATININE 0.65 0.95  GLUCOSE 99 103*  CALCIUM 8.3* 7.8*   Recent Labs    08/04/23 1635  INR 1.1     EXAM General - Patient is Alert and Oriented Extremity - Neurovascular intact Sensation intact distally Dorsiflexion/Plantar flexion intact Compartment soft Dressing/Incision - clean, dry, and intact at this time.  Dressing change last night.  Saturation to the dressing last night. Motor Function - intact, moving foot and toes well on exam.   History reviewed. No pertinent past medical history.  Assessment/Plan: 1 Day Post-Op Procedure(s) (LRB): INTRAMEDULLARY (IM) NAIL INTERTROCHANTERIC (Left) Principal Problem:   Closed left subtrochanteric femur fracture  (HCC) Active Problems:   Dementia without behavioral disturbance (HCC)   Leucocytosis   Essential hypertension   Hyperlipidemia   GERD (gastroesophageal reflux disease)  Estimated body mass index is 25.88 kg/m as calculated from the following:   Height as of this encounter: 5\' 8"  (1.727 m).   Weight as of this encounter: 77.2 kg. Advance diet Up with therapy  Discharge planning: Plan to follow-up at [facility] clinic orthopedics in 2 weeks for staple removal and x-rays.  Plan to discharge to rehab facility.  Dressing changes needed.  DVT Prophylaxis - Lovenox, Foot Pumps, and TED hose Weight-Bearing as tolerated to left leg  Dedra Skeens, PA-C Orthopaedic Surgery 08/07/2023, 6:15 AM

## 2023-08-07 NOTE — Assessment & Plan Note (Signed)
Hemoglobin decreased to 8.1 from 12 on admission.  Likely secondary to bleeding with fracture and then during surgery. Patient is becoming symptomatic, appears pale and had a vasovagal episode. Positive orthostatic vitals. -Ordered 1 unit of PRBC -Increasing the dose of iron supplement

## 2023-08-07 NOTE — Evaluation (Addendum)
Physical Therapy Evaluation Patient Details Name: Leah Mitchell MRN: [id number] DOB: [birth year] Today's Date: 08/07/2023  History of Present Illness  Patient is a 77 year old female with fall at home, found to have left reverse obliquity intertrochanteric hip fracture. s/p intramedullary nailing of left femur  Clinical Impression  Patient is agreeable to PT session with encouragement. Spouse present throughout session. The patient was independent with short distance ambulation without assistive device prior to surgery. The patient has 3 steps with rail to enter her home. The spouse reports 2 falls this year.  The patient required significant assistance with mobility today. She reports no pain but often grimaces with any movement of left leg. The patient requires +2 person assistance for transfers. One sit to stand transfer performed with seated rest break afterwards. Patient is fatigued with activity. Second standing bout performed with pivot to chair. Once in the chair, patient had a brief period of decreased responsiveness but responds to voice. Blood pressure 104/47, Sp02 95% on 2 L02, heart rate 88bpm after getting to the chair. Nurse and MD in the room at end of session and patient alert and communicating without difficulty.  Recommend to continue PT to maximize independence and facilitate return to prior level of function. Anticipate patient could benefit from rehab <3 hours daily after this hospital stay. Spouse and patient would like to go home if possible.       If plan is discharge home, recommend the following: Two people to help with walking and/or transfers;A lot of help with bathing/dressing/bathroom;Assist for transportation;Help with stairs or ramp for entrance;Assistance with cooking/housework   Can travel by private vehicle   No    Equipment Recommendations Rolling walker (2 wheels)  Recommendations for Other Services       Functional Status Assessment Patient has had a  recent decline in their functional status and demonstrates the ability to make significant improvements in function in a reasonable and predictable amount of time.     Precautions / Restrictions Precautions Precautions: Fall Precaution Comments: monitor BP Restrictions Weight Bearing Restrictions: Yes LLE Weight Bearing: Weight bearing as tolerated      Mobility  Bed Mobility Overal bed mobility: Needs Assistance Bed Mobility: Supine to Sit, Sit to Supine     Supine to sit: Max assist     General bed mobility comments: increased time and effort required with all mobility. maximal cues for sequencing and technique    Transfers Overall transfer level: Needs assistance Equipment used: Rolling walker (2 wheels) Transfers: Sit to/from Stand, Bed to chair/wheelchair/BSC Sit to Stand: Max assist, +2 physical assistance Stand pivot transfers: Max assist, +2 physical assistance         General transfer comment: 2 standing bouts performed with seated rest break between bouts of standing. after transfer to the chair, patient was less responsive briefly but responds to sternal rub and verbal cues. Sp02 95% on 2 L02, heart rate 88bpm, blood pressure 104/47 mmHg. nurse aware made aware and MD Dr Nelson Chimes present for end of session. spouse present throughout session    Ambulation/Gait             Pre-gait activities: standing tolerance limtied to ~ 20 seconds General Gait Details: unable to at this time  Stairs            Wheelchair Mobility     Tilt Bed    Modified Rankin (Stroke Patients Only)       Balance Overall balance assessment: Needs assistance Sitting-balance support: Feet  supported Sitting balance-Leahy Scale: Poor Sitting balance - Comments: fair to poor. patient has more pronounced anterior and right lean with fatigue   Standing balance support: Bilateral upper extremity supported Standing balance-Leahy Scale: Poor Standing balance comment: external  support required and heavy reliance on rolling walker for support                             Pertinent Vitals/Pain Pain Assessment Pain Assessment:  (patient declined pain several times but has grimicing with weight bearing on LLE)    Home Living Family/patient expects to be discharged to:: Private residence Living Arrangements: Spouse/significant other Available Help at Discharge: Family;Available 24 hours/day Type of Home: House Home Access: Stairs to enter Entrance Stairs-Rails: Left Entrance Stairs-Number of Steps: 3   Home Layout: One level Home Equipment: None      Prior Function Prior Level of Function : Independent/Modified Independent             Mobility Comments: limited distance with ambulation       Extremity/Trunk Assessment   Upper Extremity Assessment Upper Extremity Assessment: Generalized weakness    Lower Extremity Assessment Lower Extremity Assessment: LLE deficits/detail LLE Deficits / Details: limited AROM with L hip, grimicing with movement although she reports no pain       Communication   Communication Communication: No apparent difficulties  Cognition Arousal: Alert Behavior During Therapy: WFL for tasks assessed/performed Overall Cognitive Status: Impaired/Different from baseline Area of Impairment: Following commands, Memory, Problem solving                     Memory: Decreased short-term memory Following Commands: Follows one step commands with increased time     Problem Solving: Slow processing, Decreased initiation, Difficulty sequencing, Requires verbal cues, Requires tactile cues General Comments: mild delay with motor planning. she is able to follow single step commands with increased time        General Comments      Exercises     Assessment/Plan    PT Assessment Patient needs continued PT services  PT Problem List Decreased strength;Decreased range of motion;Decreased activity  tolerance;Decreased mobility;Decreased balance;Decreased knowledge of use of DME;Decreased safety awareness;Decreased knowledge of precautions       PT Treatment Interventions DME instruction;Stair training;Gait training;Functional mobility training;Therapeutic activities;Therapeutic exercise;Balance training;Neuromuscular re-education;Cognitive remediation;Patient/family education    PT Goals (Current goals can be found in the Care Plan section)  Acute Rehab PT Goals Patient Stated Goal: to return home PT Goal Formulation: With patient/family Time For Goal Achievement: 08/21/23 Potential to Achieve Goals: Fair    Frequency Min 1X/week     Co-evaluation               AM-PAC PT "6 Clicks" Mobility  Outcome Measure Help needed turning from your back to your side while in a flat bed without using bedrails?: A Lot Help needed moving from lying on your back to sitting on the side of a flat bed without using bedrails?: A Lot Help needed moving to and from a bed to a chair (including a wheelchair)?: Total Help needed standing up from a chair using your arms (e.g., wheelchair or bedside chair)?: Total Help needed to walk in hospital room?: Total Help needed climbing 3-5 steps with a railing? : Total 6 Click Score: 8    End of Session Equipment Utilized During Treatment: Gait belt Activity Tolerance: Patient limited by fatigue Patient left: in chair;with call  bell/phone within reach;with chair alarm set;with family/visitor present Nurse Communication: Mobility status (BP, brief period of decreased responsiveness) PT Visit Diagnosis: Other abnormalities of gait and mobility (R26.89);Difficulty in walking, not elsewhere classified (R26.2)    Time: 8469-6295 PT Time Calculation (min) (ACUTE ONLY): 65 min   Charges:   PT Evaluation $PT Eval Moderate Complexity: 1 Mod PT Treatments $Therapeutic Activity: 23-37 mins PT General Charges $$ ACUTE PT VISIT: 1 Visit          Donna Bernard, PT, MPT  Ina Homes 08/07/2023, 12:05 PM

## 2023-08-07 NOTE — TOC CM/SW Note (Signed)
Transition of Care [facility]) - Inpatient Brief Assessment   Patient Details  Name: Leah Mitchell MRN: [id number] Date of Birth: [birth year]  Transition of Care [facility]) CM/SW Contact:    Kemper Durie, RN Phone Number: 08/07/2023, 3:44 PM   Clinical Narrative:  Brief assessment done, SNF for short term rehab per PT/OT recommendations.  Attempted to call husband to discuss, message left.   Transition of Care Asessment: Insurance and Status: Insurance coverage has been reviewed Patient has primary care physician: Yes Home environment has been reviewed: yes Prior level of function:: Has dementia, husband and daughter listed as next of kin Prior/Current Home Services: No current home services Social Determinants of Health Reivew: SDOH reviewed no interventions necessary Readmission risk has been reviewed: Yes Transition of care needs: transition of care needs identified, TOC will continue to follow

## 2023-08-07 NOTE — Discharge Instructions (Signed)
INSTRUCTIONS AFTER Surgery  Remove items at home which could result in a fall. This includes throw rugs or furniture in walking pathways ICE to the affected joint every three hours while awake for 30 minutes at a time, for at least the first 3-5 days, and then as needed for pain and swelling.  Continue to use ice for pain and swelling. You may notice swelling that will progress down to the foot and ankle.  This is normal after surgery.  Elevate your leg when you are not up walking on it.   Continue to use the breathing machine you got in the hospital (incentive spirometer) which will help keep your temperature down.  It is common for your temperature to cycle up and down following surgery, especially at night when you are not up moving around and exerting yourself.  The breathing machine keeps your lungs expanded and your temperature down.   DIET:  As you were doing prior to hospitalization, we recommend a well-balanced diet.  DRESSING / WOUND CARE / SHOWERING  Dressing change as needed.  No showering.  Patient will follow-up at [facility] clinic orthopedics in 2 weeks for staple removal and x-rays of the left hip  ACTIVITY  Increase activity slowly as tolerated, but follow the weight bearing instructions below.   No driving for 6 weeks or until further direction given by your physician.  You cannot drive while taking narcotics.  No lifting or carrying greater than 10 lbs. until further directed by your surgeon. Avoid periods of inactivity such as sitting longer than an hour when not asleep. This helps prevent blood clots.  You may return to work once you are authorized by your doctor.     WEIGHT BEARING  Weightbearing as tolerated on the left   EXERCISES Gait training and ambulation training with physical therapy.  Ambulation with a walker.  CONSTIPATION  Constipation is defined medically as fewer than three stools per week and severe constipation as less than one stool per week.  Even  if you have a regular bowel pattern at home, your normal regimen is likely to be disrupted due to multiple reasons following surgery.  Combination of anesthesia, postoperative narcotics, change in appetite and fluid intake all can affect your bowels.   YOU MUST use at least one of the following options; they are listed in order of increasing strength to get the job done.  They are all available over the counter, and you may need to use some, POSSIBLY even all of these options:    Drink plenty of fluids (prune juice may be helpful) and high fiber foods Colace 100 mg by mouth twice a day  Senokot for constipation as directed and as needed Dulcolax (bisacodyl), take with full glass of water  Miralax (polyethylene glycol) once or twice a day as needed.  If you have tried all these things and are unable to have a bowel movement in the first 3-4 days after surgery call either your surgeon or your primary doctor.    If you experience loose stools or diarrhea, hold the medications until you stool forms back up.  If your symptoms do not get better within 1 week or if they get worse, check with your doctor.  If you experience "the worst abdominal pain ever" or develop nausea or vomiting, please contact the office immediately for further recommendations for treatment.   ITCHING:  If you experience itching with your medications, try taking only a single pain pill, or even half a  pain pill at a time.  You can also use Benadryl over the counter for itching or also to help with sleep.   TED HOSE STOCKINGS:  Use stockings on both legs until for at least 2 weeks or as directed by physician office. They may be removed at night for sleeping.  MEDICATIONS:  See your medication summary on the "After Visit Summary" that nursing will review with you.  You may have some home medications which will be placed on hold until you complete the course of blood thinner medication.  It is important for you to complete the blood  thinner medication as prescribed.  PRECAUTIONS:  If you experience chest pain or shortness of breath - call 911 immediately for transfer to the hospital emergency department.   If you develop a fever greater that 101 F, purulent drainage from wound, increased redness or drainage from wound, foul odor from the wound/dressing, or calf pain - CONTACT YOUR SURGEON.                                                   FOLLOW-UP APPOINTMENTS:  If you do not already have a post-op appointment, please call the office for an appointment to be seen by your surgeon.  Guidelines for how soon to be seen are listed in your "After Visit Summary", but are typically between 1-4 weeks after surgery.  OTHER INSTRUCTIONS:     MAKE SURE YOU:  Understand these instructions.  Get help right away if you are not doing well or get worse.    Thank you for letting us be a part of your medical care team.  It is a privilege we respect greatly.  We hope these instructions will help you stay on track for a fast and full recovery!

## 2023-08-07 NOTE — Progress Notes (Signed)
Foley was removed at 0700 this morning. No urine output noted at 1600. Bladder Scan obtained ( ). MD notified. Patient wanted to try to void on own before a catheter  placement.   Patient attempt to void no luck.  Dr. Joice Lofts at bedside. Dr. Joice Lofts asked that a foley be placed.  Foley placed.

## 2023-08-08 DIAGNOSIS — F0390 Unspecified dementia without behavioral disturbance: Secondary | ICD-10-CM | POA: Diagnosis not present

## 2023-08-08 DIAGNOSIS — S7222XA Displaced subtrochanteric fracture of left femur, initial encounter for closed fracture: Secondary | ICD-10-CM | POA: Diagnosis not present

## 2023-08-08 DIAGNOSIS — I10 Essential (primary) hypertension: Secondary | ICD-10-CM | POA: Diagnosis not present

## 2023-08-08 DIAGNOSIS — D72829 Elevated white blood cell count, unspecified: Secondary | ICD-10-CM | POA: Diagnosis not present

## 2023-08-08 LAB — BASIC METABOLIC PANEL
Anion gap: 8 (ref 5–15)
BUN: 22 mg/dL (ref 8–23)
CO2: 24 mmol/L (ref 22–32)
Calcium: 7.6 mg/dL — ABNORMAL LOW (ref 8.9–10.3)
Chloride: 103 mmol/L (ref 98–111)
Creatinine, Ser: 0.76 mg/dL (ref 0.44–1.00)
GFR, Estimated: >60 mL/min (ref >60)
Glucose, Bld: 100 mg/dL — ABNORMAL HIGH (ref 70–99)
Potassium: 3.9 mmol/L (ref 3.5–5.1)
Sodium: 135 mmol/L (ref 135–145)

## 2023-08-08 LAB — CBC
HCT: 23.6 % — ABNORMAL LOW (ref 36.0–46.0)
Hemoglobin: 7.8 g/dL — ABNORMAL LOW (ref 12.0–15.0)
MCH: 30.5 pg (ref 26.0–34.0)
MCHC: 33.1 g/dL (ref 30.0–36.0)
MCV: 92.2 fL (ref 80.0–100.0)
Platelets: 201 10*3/uL (ref 150–400)
RBC: 2.56 MIL/uL — ABNORMAL LOW (ref 3.87–5.11)
RDW: 13.7 % (ref 11.5–15.5)
WBC: 12.8 10*3/uL — ABNORMAL HIGH (ref 4.0–10.5)
nRBC: 0 % (ref 0.0–0.2)

## 2023-08-08 LAB — PREPARE RBC (CROSSMATCH)

## 2023-08-08 LAB — HEMOGLOBIN AND HEMATOCRIT, BLOOD
HCT: 30.3 % — ABNORMAL LOW (ref 36.0–46.0)
Hemoglobin: 9.8 g/dL — ABNORMAL LOW (ref 12.0–15.0)

## 2023-08-08 MED ORDER — SODIUM CHLORIDE 0.9% IV SOLUTION: Freq: Once | INTRAVENOUS | Status: AC

## 2023-08-08 MED ORDER — CALCIUM CARBONATE 1250 (500 CA) MG PO TABS
1.0000 | ORAL_TABLET | Freq: Two times a day (BID) | ORAL | Status: DC
  Administered 2023-08-08 – 2023-08-11 (×7): 1250 mg via ORAL
  Filled 2023-08-08 (×8): qty 1

## 2023-08-08 NOTE — Plan of Care (Signed)

## 2023-08-08 NOTE — Evaluation (Signed)
Occupational Therapy Evaluation Patient Details Name: Leah Mitchell MRN: [id number] DOB: 1946-02-11 Today's Date: 08/08/2023   History of Present Illness Patient is a 76 year old female with fall at home, found to have left reverse obliquity intertrochanteric hip fracture. s/p intramedullary nailing of left femur   Clinical Impression   Leah Mitchell was seen for OT co-evaluation with PTA to maximize safety and progression of functional mobility this date, POD#2 from above surgery. Pt was independent in all ADLs prior to surgery, however reports limited mobility without AE. Time taken to assess orthostatic vitals during session (see below). Pt reporting increased dizziness upon standing but is - for orthostatic BP. Suspect pain limiting functional mobility this date. Pt is eager to return to PLOF with less pain and improved safety and independence. Pt currently requires +2 MOD A  assist for LB dressing and bathing while in seated position due to pain and limited AROM of L hip. Pt instructed in self care skills, falls prevention strategies, home/routines modifications, and transfer techniques. Pt would benefit from additional instruction in self care skills and techniques to help maintain precautions with or without assistive devices to support recall and carryover prior to discharge. Anticipate the need for follow up therapy services upon acute hospital DC. (</= 3 hours / day)    08/08/23 0845  Therapy Vitals  Patient Position (if appropriate) Orthostatic Vitals  Orthostatic Lying   BP- Lying 131/57  Pulse- Lying 90  Orthostatic Sitting  BP- Sitting 154/72  Pulse- Sitting 98  Orthostatic Standing at 3 minutes  BP- Standing at 3 minutes (!) 131/98  Pulse- Standing at 3 minutes 106 (Immediately upon returning to chair.)  Oxygen Therapy  SpO2 99 %        If plan is discharge home, recommend the following: Two people to help with walking and/or transfers;Two people to help with  bathing/dressing/bathroom;Assistance with cooking/housework;Assist for transportation;Help with stairs or ramp for entrance    Functional Status Assessment  Patient has had a recent decline in their functional status and demonstrates the ability to make significant improvements in function in a reasonable and predictable amount of time.  Equipment Recommendations  BSC/3in1    Recommendations for Other Services       Precautions / Restrictions Precautions Precautions: Fall Precaution Comments: monitor BP Restrictions Weight Bearing Restrictions: Yes LLE Weight Bearing: Weight bearing as tolerated      Mobility Bed Mobility Overal bed mobility: Needs Assistance Bed Mobility: Supine to Sit     Supine to sit: Max assist, +2 for safety/equipment     General bed mobility comments: increased time and effort required with all mobility. maximal cues for sequencing and technique    Transfers Overall transfer level: Needs assistance Equipment used: Rolling walker (2 wheels) Transfers: Sit to/from Stand Sit to Stand: Mod assist, +2 physical assistance Stand pivot transfers: Mod assist, +2 physical assistance         General transfer comment: very slow hesitant steps to chair and needed increased assist to guide her to chair.      Balance Overall balance assessment: Needs assistance Sitting-balance support: Feet supported, Bilateral upper extremity supported Sitting balance-Leahy Scale: Poor Sitting balance - Comments: fair to poor. patient has more pronounced anterior and right lean with fatigue, requires intermittent MIN A to maintain upright posture.   Standing balance support: Bilateral upper extremity supported, Reliant on assistive device for balance, During functional activity Standing balance-Leahy Scale: Poor Standing balance comment: external support required and heavy reliance on rolling  walker for support                           ADL either performed  or assessed with clinical judgement   ADL Overall ADL's : Needs assistance/impaired                                       General ADL Comments: Pt significantly functionally limited by increased pain, generalized weakness, and decreased activity tolerance. She requires +2 MAX A for bed mobility and +2 MOD A for STS and to step pivot to recliner. Anticipate +2 MOD assist for LB ADL management including bathing, dressing and toileting.     Vision Patient Visual Report: No change from baseline       Perception         Praxis         Pertinent Vitals/Pain Pain Assessment Pain Assessment: Faces Faces Pain Scale: Hurts whole lot Pain Location: L hip Pain Descriptors / Indicators: Aching, Sore, Grimacing Pain Intervention(s): Limited activity within patient's tolerance, Monitored during session, Repositioned, Patient requesting pain meds-RN notified     Extremity/Trunk Assessment Upper Extremity Assessment Upper Extremity Assessment: Generalized weakness   Lower Extremity Assessment Lower Extremity Assessment: LLE deficits/detail LLE Deficits / Details: s/p L IM NAIL WBAT, very pain limited POD#2       Communication Communication Communication: No apparent difficulties Cueing Techniques: Verbal cues;Tactile cues;Gestural cues   Cognition Arousal: Alert Behavior During Therapy: WFL for tasks assessed/performed Overall Cognitive Status: Within Functional Limits for tasks assessed                         Following Commands: Follows one step commands with increased time       General Comments: mild delay with motor planning. she is able to follow single step commands with increased time     General Comments  Orthostatic vitals monitored. See above. Pt reports dizziness upon standing for ~3 min but vitals remained stable.    Exercises Other Exercises Other Exercises: Pt educated on role f OT in acute setting, safety, falls prevention  strategies, and DC recs.   Shoulder Instructions      Home Living Family/patient expects to be discharged to:: Private residence Living Arrangements: Spouse/significant other Available Help at Discharge: Family;Available 24 hours/day Type of Home: House Home Access: Stairs to enter [employer] of Steps: 3 Entrance Stairs-Rails: Left Home Layout: One level     Bathroom Shower/Tub: Chief Strategy Officer: Handicapped height     Home Equipment: None          Prior Functioning/Environment Prior Level of Function : Independent/Modified Independent             Mobility Comments: limited distance with ambulation ADLs Comments: Reports she was totally independent with ADL management at baseline. Denies additional falls history.        OT Problem List: Decreased strength;Pain;Decreased range of motion;Decreased activity tolerance;Decreased safety awareness;Impaired balance (sitting and/or standing);Decreased knowledge of use of DME or AE;Decreased knowledge of precautions      OT Treatment/Interventions: Self-care/ADL training;Therapeutic exercise;Therapeutic activities;Balance training;DME and/or AE instruction;Patient/family education    OT Goals(Current goals can be found in the care plan section) Acute Rehab OT Goals Patient Stated Goal: To go home OT Goal Formulation: With patient Time For Goal Achievement: 08/22/23 Potential to  Achieve Goals: Fair ADL Goals Pt Will Perform Grooming: sitting;with set-up;with supervision Pt Will Perform Lower Body Dressing: sit to/from stand;with min assist;with adaptive equipment Pt Will Transfer to Toilet: bedside commode;stand pivot transfer;with supervision;with set-up Pt Will Perform Toileting - Clothing Manipulation and hygiene: sit to/from stand;with supervision;with set-up;with adaptive equipment  OT Frequency: Min 1X/week    Co-evaluation PT/OT/SLP Co-Evaluation/Treatment: Yes Reason for  Co-Treatment: For patient/therapist safety PT goals addressed during session: Mobility/safety with mobility;Balance;Proper use of DME OT goals addressed during session: ADL's and self-care      AM-PAC OT "6 Clicks" Daily Activity     Outcome Measure Help from another person eating meals?: A Little Help from another person taking care of personal grooming?: A Little Help from another person toileting, which includes using toliet, bedpan, or urinal?: A Lot Help from another person bathing (including washing, rinsing, drying)?: A Lot Help from another person to put on and taking off regular upper body clothing?: A Lot Help from another person to put on and taking off regular lower body clothing?: A Lot 6 Click Score: 14   End of Session Equipment Utilized During Treatment: Gait belt;Rolling walker (2 wheels)  Activity Tolerance: Patient limited by pain Patient left: in chair;with call bell/phone within reach;with chair alarm set  OT Visit Diagnosis: Other abnormalities of gait and mobility (R26.89);Pain Pain - Right/Left: Left Pain - part of body: Hip                Time: 0981-1914 OT Time Calculation (min): 24 min Charges:  OT General Charges $OT Visit: 1 Visit OT Evaluation $OT Eval Moderate Complexity: 1 Mod OT Treatments $Self Care/Home Management : 8-22 mins  Rockney Ghee, M.S., OTR/L 08/08/23, 10:26 AM

## 2023-08-08 NOTE — Progress Notes (Signed)
Progress Note   [name] TDV:[id number] DOB: 20-Jul-1946 DOA: 08/04/2023     4 DOS: the patient was seen and examined on 08/08/2023   Brief hospital course: Taken from H&P.   Leah Mitchell is a 77 y.o. female with medical history significant of dementia, suspected hypertension but normal recorded who was brought in to the ER after sustaining a mechanical fall. She was found to have left femoral fracture. Patient otherwise hemodynamically stable. Orthopedics consulted and Dr. Allena Katz plans repair for hip fracture tomorrow.   9/5: Slight decrease in hemoglobin today.  Awaiting surgery.  Switched to Dilaudid as morphine was not very helpful for pain.  9/6: Surgery got postponed yesterday for today.  Further decrease in hemoglobin to 10.3, all cell lines decreased so some dilutional effect with IV fluid.  Having right knee pain, imaging with arthritis.  9/7: S/p ORIF on 08/06/2023, tolerated the procedure well.  She will be weightbearing as tolerated per orthopedic surgery.  Hemoglobin decreased to 8.1 postsurgically, increasing the dose of iron supplement to twice daily, patient had a vasovagal syncopal episode, feeling very weak and pale day-ordered 1 unit of PRBC.  9/8: Vital stable, unable to void after removal of Foley yesterday, Foley was replaced after doing 1 time in and out cath.  Hemoglobin at 7.8 from 8.1 even with 1 unit of PRBC, some bleeding from the surgical site.  Ordered 1 more unit.  Mild hypocalcemia-starting on p.o. supplement and checking vitamin D level. PT is recommending SNF-husband will see if she can go home in next 1 to 2 days.  Assessment and Plan: * Closed left subtrochanteric femur fracture (HCC) Secondary to mechanical fall.  S/p ORIF on 08/06/2023 -Continue with supportive care and pain management -PT and OT evaluation  Acute postoperative anemia due to expected blood loss Hemoglobin 7.8, further decrease after getting 1 unit of PRBC, bleeding from surgical  site.  Fracture and surgery is also contributory -Ordered 1 more unit of PRBC -Increasing the dose of iron supplement  Leucocytosis Likely reactive with this recent fracture and fall.  Slowly improving -Continue to monitor  Essential hypertension -Continue home amlodipine  -As needed labetalol  Dementia without behavioral disturbance (HCC) -Continue home Aricept -Delirium precautions  Hyperlipidemia -Continue home Lipitor  GERD (gastroesophageal reflux disease) -Continue PPI   Subjective: Patient was sitting in chair when seen today.  Pain seems well-controlled with current regimen.  Still having some blood on her dressing but much improved than yesterday.  Physical Exam: Vitals:   08/08/23 1126 08/08/23 1143 08/08/23 1153 08/08/23 1251  BP: 128/63 137/61  139/62  Pulse: 88 100  82  Resp: 20 (!) 24  (!) 24  Temp: 97.8 F (36.6 C) 98.3 F (36.8 C)  98.1 F (36.7 C)  TempSrc:  Oral    SpO2: 99% 100% 100% 100%  Weight:      Height:       General.  Frail elderly lady, in no acute distress. Pulmonary.  Lungs clear bilaterally, normal respiratory effort. CV.  Regular rate and rhythm, no JVD, rub or murmur. Abdomen.  Soft, nontender, nondistended, BS positive. CNS.  Alert and oriented .  No focal neurologic deficit. Extremities.  No edema, no cyanosis, pulses intact and symmetrical. Psychiatry.  Judgment and insight appears normal.    Data Reviewed: Prior data reviewed  Family Communication: Discussed with husband at bedside  Disposition: Status is: Inpatient Remains inpatient appropriate because: Severity of illness  Planned Discharge Destination:  To be decided  Time spent: 44 minutes  This record has been created using Conservation officer, historic buildings. Errors have been sought and corrected,but may not always be located. Such creation errors do not reflect on the standard of care.   Author: Arnetha Courser, MD 08/08/2023 1:21 PM  For on call review  [email or web address].

## 2023-08-08 NOTE — Progress Notes (Signed)
Physical Therapy Treatment Patient Details Name: Leah Mitchell MRN: [id number] DOB: [birth year] Today's Date: 08/08/2023   History of Present Illness Patient is a 77 year old female with fall at home, found to have left reverse obliquity intertrochanteric hip fracture. s/p intramedullary nailing of left femur    PT Comments  Co-tx with OT (eval) for pt and staff safety.  LLE AAROM prior to mobility.  She is assisted to EOB with max a x  2 with increased time given due to hesitancy with movement.  Once sitting she requires min a x 1 for sitting balance and cues to remain upright.  Stands from elevated bed with mod a x 2 and once standing is generally min a x 2 for balance but she does struggle stepping to chair with no true step height.  She is able to turn 1/2 way to chair before getting "stuck" and needing assist to guide her to a sitting position.  BP is monitored and orthostatics are not as issue today.  She does stand for about 4 minutes during transfer attempt.  Recommend +2 for return to bed.   If plan is discharge home, recommend the following: Two people to help with walking and/or transfers;A lot of help with bathing/dressing/bathroom;Assist for transportation;Help with stairs or ramp for entrance;Assistance with cooking/housework   Can travel by private vehicle        Equipment Recommendations  Rolling walker (2 wheels)    Recommendations for Other Services       Precautions / Restrictions Precautions Precautions: Fall Precaution Comments: monitor BP Restrictions Weight Bearing Restrictions: Yes LLE Weight Bearing: Weight bearing as tolerated     Mobility  Bed Mobility Overal bed mobility: Needs Assistance Bed Mobility: Supine to Sit     Supine to sit: Max assist, +2 for safety/equipment          Transfers Overall transfer level: Needs assistance Equipment used: Rolling walker (2 wheels) Transfers: Sit to/from Stand Sit to Stand: Mod assist, +2 physical  assistance Stand pivot transfers: Mod assist, +2 physical assistance         General transfer comment: very slow hesitant steps to chair and needed increased assist to guide her to chair.    Ambulation/Gait Ambulation/Gait assistance: Mod assist, +2 physical assistance Gait Distance (Feet): 2 Feet Assistive device: Rolling walker (2 wheels) Gait Pattern/deviations: Step-to pattern Gait velocity: dec     General Gait Details: step-to shimmy step pain with no true step height when advancing   Stairs             Wheelchair Mobility     Tilt Bed    Modified Rankin (Stroke Patients Only)       Balance Overall balance assessment: Needs assistance Sitting-balance support: Feet supported Sitting balance-Leahy Scale: Poor Sitting balance - Comments: fair to poor. patient has more pronounced anterior and right lean with fatigue   Standing balance support: Bilateral upper extremity supported Standing balance-Leahy Scale: Poor Standing balance comment: external support required and heavy reliance on rolling walker for support                            Cognition Arousal: Alert Behavior During Therapy: WFL for tasks assessed/performed Overall Cognitive Status: Within Functional Limits for tasks assessed  Exercises Other Exercises Other Exercises: supine LLE AAROM prior to mobilty    General Comments        Pertinent Vitals/Pain Pain Assessment Pain Assessment: Faces Faces Pain Scale: Hurts whole lot Pain Location: L hip Pain Descriptors / Indicators: Aching, Sore, Grimacing Pain Intervention(s): Limited activity within patient's tolerance, Monitored during session, Repositioned, Patient requesting pain meds-RN notified    Home Living                          Prior Function            PT Goals (current goals can now be found in the care plan section) Progress towards PT  goals: Progressing toward goals    Frequency    Min 1X/week      PT Plan      Co-evaluation PT/OT/SLP Co-Evaluation/Treatment: Yes Reason for Co-Treatment: For patient/therapist safety PT goals addressed during session: Mobility/safety with mobility;Balance;Proper use of DME OT goals addressed during session: Other (comment)      AM-PAC PT "6 Clicks" Mobility   Outcome Measure  Help needed turning from your back to your side while in a flat bed without using bedrails?: A Lot Help needed moving from lying on your back to sitting on the side of a flat bed without using bedrails?: A Lot Help needed moving to and from a bed to a chair (including a wheelchair)?: A Lot Help needed standing up from a chair using your arms (e.g., wheelchair or bedside chair)?: A Lot Help needed to walk in hospital room?: Total Help needed climbing 3-5 steps with a railing? : Total 6 Click Score: 10    End of Session Equipment Utilized During Treatment: Gait belt Activity Tolerance: Patient limited by fatigue;Patient limited by pain Patient left: in chair;with call bell/phone within reach;with chair alarm set;with family/visitor present Nurse Communication: Mobility status;Patient requests pain meds PT Visit Diagnosis: Other abnormalities of gait and mobility (R26.89);Difficulty in walking, not elsewhere classified (R26.2)     Time: 4098-1191 PT Time Calculation (min) (ACUTE ONLY): 23 min  Charges:    $Therapeutic Exercise: 8-22 mins $Therapeutic Activity: 8-22 mins PT General Charges $$ ACUTE PT VISIT: 1 Visit                   Danielle Dess, PTA 08/08/23, 10:08 AM

## 2023-08-08 NOTE — TOC Progression Note (Signed)
Transition of Care [facility]) - Progression Note    Patient Details  Name: Leah Mitchell MRN: [id number] Date of Birth: [birth year]  Transition of Care [facility]) CM/SW Contact  Susa Simmonds, [place] Phone Number: 08/08/2023, 4:09 PM  Clinical Narrative:  CSW spoke with patients husband and daughter. Patients family agree with SNF recommendations. Family stated they prefer [facility] if possible. SNF bed search started.     Expected Discharge Plan:  (TBD) Barriers to Discharge: Continued Medical Work up  Expected Discharge Plan and Services   Discharge Planning Services: CM Consult   Living arrangements for the past 2 months: Single Family Home                                       Social Determinants of Health (SDOH) Interventions    Readmission Risk Interventions     No data to display

## 2023-08-08 NOTE — [place] FL2 (Signed)
[place] MEDICAID FL2 LEVEL OF CARE FORM     IDENTIFICATION  Patient Name: Leah Mitchell Birthdate: [birth year] Sex: female Admission Date (Current Location): 08/04/2023  [facility] and [place] Number:  Chiropodist and Address:  [facility], [address], [place], [place] [address]      Provider Number: [id number]  Attending Physician Name and Address:  Arnetha Courser, MD  Relative Name and Phone Number:  Sivi, [place]bar (Spouse)  [phone], Marval Regal (Daughter)  [phone]    Current Level of Care: Hospital Recommended Level of Care: Skilled Nursing Facility Prior Approval Number:    Date Approved/Denied:   PASRR Number: [id number]  Discharge Plan: SNF    Current Diagnoses: Patient Active Problem List   Diagnosis Date Noted   Acute postoperative anemia due to expected blood loss 08/07/2023   Essential hypertension 08/05/2023   Hyperlipidemia 08/05/2023   GERD (gastroesophageal reflux disease) 08/05/2023   Dementia without behavioral disturbance (HCC) 08/04/2023   Leucocytosis 08/04/2023   Closed left subtrochanteric femur fracture (HCC) 08/04/2023    Orientation RESPIRATION BLADDER Height & Weight     Self, Time, Place  Normal Indwelling catheter Weight: 170 lb 3.2 oz (77.2 kg) Height:  5\' 8"  (172.7 cm)  BEHAVIORAL SYMPTOMS/MOOD NEUROLOGICAL BOWEL NUTRITION STATUS      Continent Diet (Regular)  AMBULATORY STATUS COMMUNICATION OF NEEDS Skin   Extensive Assist Verbally Normal                       Personal Care Assistance Level of Assistance  Bathing, Feeding, Dressing Bathing Assistance: Maximum assistance Feeding assistance: Independent Dressing Assistance: Maximum assistance     Functional Limitations Info  Sight, Hearing, Speech Sight Info: Adequate Hearing Info: Adequate Speech Info: Adequate    SPECIAL CARE FACTORS FREQUENCY  PT (By licensed PT), OT (By licensed OT)     PT Frequency: X5 Weekly OT  Frequency: X5 Weekly            Contractures Contractures Info: Not present    Additional Factors Info  Allergies, Code Status Code Status Info: Full Allergies Info: Aspirin, Prednisone, Sulfa Antibiotics           Current Medications (08/08/2023):  This is the current hospital active medication list Current Facility-Administered Medications  Medication Dose Route Frequency Provider Last Rate Last Admin   0.9 %  sodium chloride infusion   Intravenous Continuous Signa Kell, MD 75 mL/hr at 08/06/23 2304 New Bag at 08/06/23 2304   acetaminophen (TYLENOL) tablet 1,000 mg  1,000 mg Oral Q8H Signa Kell, MD   1,000 mg at 08/08/23 1534   amLODipine (NORVASC) tablet 2.5 mg  2.5 mg Oral Daily Signa Kell, MD   2.5 mg at 08/08/23 0950   atorvastatin (LIPITOR) tablet 10 mg  10 mg Oral Daily Signa Kell, MD   10 mg at 08/08/23 0950   bisacodyl (DULCOLAX) suppository 10 mg  10 mg Rectal Daily PRN Signa Kell, MD       calcium carbonate (OS-CAL - dosed in mg of elemental calcium) tablet 1,250 mg  1 tablet Oral BID WC Arnetha Courser, MD   1,250 mg at 08/08/23 0950   docusate sodium (COLACE) capsule 100 mg  100 mg Oral BID Signa Kell, MD   100 mg at 08/08/23 0950   donepezil (ARICEPT) tablet 5 mg  5 mg Oral QHS Signa Kell, MD   5 mg at 08/07/23 0144   enoxaparin (LOVENOX) injection 40 mg  40 mg Subcutaneous  Q24H Signa Kell, MD   40 mg at 08/08/23 0951   Fe Fum-Vit C-Vit B12-FA (TRIGELS-F FORTE) capsule 1 capsule  1 capsule Oral BID Arnetha Courser, MD   1 capsule at 08/08/23 0950   HYDROmorphone (DILAUDID) injection 0.2-0.4 mg  0.2-0.4 mg Intravenous Q4H PRN Signa Kell, MD       labetalol (NORMODYNE) injection 10 mg  10 mg Intravenous Q2H PRN Signa Kell, MD   10 mg at 08/05/23 1647   lidocaine (LIDODERM) 5 % 1 patch  1 patch Transdermal Q24H Poggi, Excell Seltzer, MD   1 patch at 08/07/23 1823   methocarbamol (ROBAXIN) tablet 500 mg  500 mg Oral Q6H PRN Signa Kell, MD       Or    methocarbamol (ROBAXIN) 500 mg in dextrose 5 % 50 mL IVPB  500 mg Intravenous Q6H PRN Signa Kell, MD       metoCLOPramide (REGLAN) tablet 5-10 mg  5-10 mg Oral Q8H PRN Signa Kell, MD       Or   metoCLOPramide (REGLAN) injection 5-10 mg  5-10 mg Intravenous Q8H PRN Signa Kell, MD       ondansetron [facility]) tablet 4 mg  4 mg Oral Q6H PRN Signa Kell, MD       Or   ondansetron [facility]) injection 4 mg  4 mg Intravenous Q6H PRN Signa Kell, MD       oxyCODONE (Oxy IR/ROXICODONE) immediate release tablet 2.5-5 mg  2.5-5 mg Oral Q4H PRN Signa Kell, MD   5 mg at 08/08/23 1239   oxyCODONE (Oxy IR/ROXICODONE) immediate release tablet 5-10 mg  5-10 mg Oral Q4H PRN Signa Kell, MD       pantoprazole (PROTONIX) EC tablet 40 mg  40 mg Oral Daily Signa Kell, MD   40 mg at 08/08/23 0950   senna-docusate (Senokot-S) tablet 1 tablet  1 tablet Oral QHS PRN Signa Kell, MD       sodium phosphate (FLEET) enema 1 enema  1 enema Rectal Once PRN Signa Kell, MD       traMADol Janean Sark) tablet 50 mg  50 mg Oral Q6H PRN Signa Kell, MD         Discharge Medications: Please see discharge summary for a list of discharge medications.  Relevant Imaging Results:  Relevant Lab Results:   Additional Information SSN: [id number]  Susa Simmonds, LCSWA

## 2023-08-08 NOTE — Progress Notes (Signed)
  Subjective: 2 Days Post-Op Procedure(s) (LRB): INTRAMEDULLARY (IM) NAIL INTERTROCHANTERIC (Left) Patient reports pain as mild.   Patient is well, and has had no acute complaints or problems Plan is to go Rehab after hospital stay. Negative for chest pain and shortness of breath Fever: no Gastrointestinal: Negative for nausea and vomiting  Objective: Vital signs in last 24 hours: Temp:  [97.5 F (36.4 C)-98.6 F (37 C)] 98.2 F (36.8 C) (09/07 2349) Pulse Rate:  [86-103] 103 (09/07 2349) Resp:  [14-19] 18 (09/07 2349) BP: (104-144)/(47-55) 144/54 (09/07 2349) SpO2:  [93 %-100 %] 95 % (09/07 2349)  Intake/Output from previous day:  Intake/Output Summary (Last 24 hours) at 08/08/2023 0702 Last data filed at 08/08/2023 0546 Gross per 24 hour  Intake 1910 ml  Output 600 ml  Net 1310 ml    Intake/Output this shift: No intake/output data recorded.  Labs: Recent Labs    08/06/23 0407 08/07/23 0530 08/08/23 0331  HGB 10.3* 8.1* 7.8*   Recent Labs    08/07/23 0530 08/08/23 0331  WBC 11.1* 12.8*  RBC 2.62* 2.56*  HCT 24.5* 23.6*  PLT 199 201   Recent Labs    08/07/23 0530 08/08/23 0331  NA 135 135  K 4.1 3.9  CL 103 103  CO2 25 24  BUN 17 22  CREATININE 0.95 0.76  GLUCOSE 103* 100*  CALCIUM 7.8* 7.6*   No results for input(s): "LABPT", "INR" in the last 72 hours.    EXAM General - Patient is Alert and Oriented Extremity - Neurovascular intact Sensation intact distally Dorsiflexion/Plantar flexion intact Compartment soft Dressing/Incision - clean, dry, and intact at this time.  Dressing change last night.  Saturation to the dressing last night. Motor Function - intact, moving foot and toes well on exam.  Minimal ambulation  History reviewed. No pertinent past medical history.  Assessment/Plan: 2 Days Post-Op Procedure(s) (LRB): INTRAMEDULLARY (IM) NAIL INTERTROCHANTERIC (Left) Principal Problem:   Closed left subtrochanteric femur fracture  (HCC) Active Problems:   Dementia without behavioral disturbance (HCC)   Leucocytosis   Essential hypertension   Hyperlipidemia   GERD (gastroesophageal reflux disease)   Acute postoperative anemia due to expected blood loss  Estimated body mass index is 25.88 kg/m as calculated from the following:   Height as of this encounter: 5\' 8"  (1.727 m).   Weight as of this encounter: 77.2 kg. Advance diet Up with therapy  Acute blood loss anemia.  Hemoglobin 7.8.  Watch for possible transfusion  Discharge planning: Plan to follow-up at [facility] clinic orthopedics in 2 weeks for staple removal and x-rays.  Plan to discharge to rehab facility.  Dressing changes needed.  DVT Prophylaxis - Lovenox, Foot Pumps, and TED hose Weight-Bearing as tolerated to left leg  Dedra Skeens, PA-C Orthopaedic Surgery 08/08/2023, 7:02 AM

## 2023-08-08 NOTE — Plan of Care (Signed)

## 2023-08-09 DIAGNOSIS — F0390 Unspecified dementia without behavioral disturbance: Secondary | ICD-10-CM | POA: Diagnosis not present

## 2023-08-09 DIAGNOSIS — I10 Essential (primary) hypertension: Secondary | ICD-10-CM | POA: Diagnosis not present

## 2023-08-09 DIAGNOSIS — D72829 Elevated white blood cell count, unspecified: Secondary | ICD-10-CM | POA: Diagnosis not present

## 2023-08-09 DIAGNOSIS — S7222XA Displaced subtrochanteric fracture of left femur, initial encounter for closed fracture: Secondary | ICD-10-CM | POA: Diagnosis not present

## 2023-08-09 LAB — TYPE AND SCREEN
ABO/RH(D): O NEG
Antibody Screen: NEGATIVE
Unit division: 0
Unit division: 0

## 2023-08-09 LAB — BASIC METABOLIC PANEL
Anion gap: 4 — ABNORMAL LOW (ref 5–15)
BUN: 20 mg/dL (ref 8–23)
CO2: 25 mmol/L (ref 22–32)
Calcium: 7.8 mg/dL — ABNORMAL LOW (ref 8.9–10.3)
Chloride: 107 mmol/L (ref 98–111)
Creatinine, Ser: 0.67 mg/dL (ref 0.44–1.00)
GFR, Estimated: >60 mL/min (ref >60)
Glucose, Bld: 96 mg/dL (ref 70–99)
Potassium: 3.5 mmol/L (ref 3.5–5.1)
Sodium: 136 mmol/L (ref 135–145)

## 2023-08-09 LAB — BPAM RBC
Blood Product Expiration Date: 202410072359
Blood Product Expiration Date: 202410072359
ISSUE DATE / TIME: 202409071158
ISSUE DATE / TIME: 202409081117
Unit Type and Rh: 5100
Unit Type and Rh: 5100

## 2023-08-09 LAB — CBC
HCT: 24.7 % — ABNORMAL LOW (ref 36.0–46.0)
Hemoglobin: 8.2 g/dL — ABNORMAL LOW (ref 12.0–15.0)
MCH: 30.8 pg (ref 26.0–34.0)
MCHC: 33.2 g/dL (ref 30.0–36.0)
MCV: 92.9 fL (ref 80.0–100.0)
Platelets: 230 10*3/uL (ref 150–400)
RBC: 2.66 MIL/uL — ABNORMAL LOW (ref 3.87–5.11)
RDW: 14 % (ref 11.5–15.5)
WBC: 13.2 10*3/uL — ABNORMAL HIGH (ref 4.0–10.5)
nRBC: 0.2 % (ref 0.0–0.2)

## 2023-08-09 LAB — VITAMIN D 25 HYDROXY (VIT D DEFICIENCY, FRACTURES): Vit D, 25-Hydroxy: 51.68 ng/mL (ref 30–100)

## 2023-08-09 MED ORDER — CHLORHEXIDINE GLUCONATE CLOTH 2 % EX PADS
6.0000 | MEDICATED_PAD | Freq: Every day | CUTANEOUS | Status: DC
  Administered 2023-08-09 – 2023-08-11 (×2): 6 via TOPICAL

## 2023-08-09 NOTE — Plan of Care (Signed)

## 2023-08-09 NOTE — Progress Notes (Signed)
Physical Therapy Treatment Patient Details Name: Leah Mitchell MRN: [id number] DOB: 24-Mar-1946 Today's Date: 08/09/2023   History of Present Illness Patient is a 77 year old female with fall at home, found to have left reverse obliquity intertrochanteric hip fracture. s/p intramedullary nailing of left femur    PT Comments  Pt on BSC upon arrival, ready to get up.  Assisted nursing.  Stood from [facility] with mod a x 2 to stand to RW and mod a x 1 to remain standing for nursing care.  She is unable to take any functional steps and when asked fear increases and she needs mod/max a x 2 to safely transition to recliner as BSC is removed and recliner brought up behind her.  HR noted to be 160 after activity and slowly decreases with time.  Husband in for session and daughter arrives at end.  Discussed progress and barriers at this time to progression of mobility as they are both anxious for her to begin walking.  Explained pain and fear are primary limitors at this time.  Encouragement given.  DM to MD regarding HR.   If plan is discharge home, recommend the following: Two people to help with walking and/or transfers;A lot of help with bathing/dressing/bathroom;Assist for transportation;Help with stairs or ramp for entrance;Assistance with cooking/housework   Can travel by private vehicle        Equipment Recommendations  Rolling walker (2 wheels)    Recommendations for Other Services       Precautions / Restrictions Precautions Precautions: Fall Precaution Comments: monitor BP Restrictions Weight Bearing Restrictions: Yes LLE Weight Bearing: Weight bearing as tolerated     Mobility  Bed Mobility                 Patient Response: Anxious  Transfers       Sit to Stand: Mod assist, +2 physical assistance Stand pivot transfers: +2 physical assistance         General transfer comment: on [facility] upon arrival    Ambulation/Gait Ambulation/Gait assistance: Mod assist, +2  physical assistance, Max assist Gait Distance (Feet): 1 Feet Assistive device: Rolling walker (2 wheels) Gait Pattern/deviations: Step-to pattern Gait velocity: dec     General Gait Details: fear is primary barrier at this point to progression   Stairs             Wheelchair Mobility     Tilt Bed Tilt Bed Patient Response: Anxious  Modified Rankin (Stroke Patients Only)       Balance Overall balance assessment: Needs assistance Sitting-balance support: Feet supported, Bilateral upper extremity supported Sitting balance-Leahy Scale: Poor Sitting balance - Comments: BUE support   Standing balance support: Bilateral upper extremity supported, Reliant on assistive device for balance, During functional activity Standing balance-Leahy Scale: Zero Standing balance comment: external support required and heavy reliance on rolling walker for support, initially poor but when scare she needs increased support                            Cognition Arousal: Alert Behavior During Therapy: WFL for tasks assessed/performed Overall Cognitive Status: Within Functional Limits for tasks assessed                                          Exercises      General Comments  Pertinent Vitals/Pain Pain Assessment Pain Assessment: Faces Faces Pain Scale: Hurts whole lot Pain Location: L hip Pain Descriptors / Indicators: Aching, Sore, Grimacing Pain Intervention(s): Limited activity within patient's tolerance, Monitored during session, Repositioned    Home Living                          Prior Function            PT Goals (current goals can now be found in the care plan section) Progress towards PT goals: Progressing toward goals    Frequency    Min 1X/week      PT Plan      Co-evaluation              AM-PAC PT "6 Clicks" Mobility   Outcome Measure  Help needed turning from your back to your side while in a flat  bed without using bedrails?: A Lot Help needed moving from lying on your back to sitting on the side of a flat bed without using bedrails?: A Lot Help needed moving to and from a bed to a chair (including a wheelchair)?: A Lot Help needed standing up from a chair using your arms (e.g., wheelchair or bedside chair)?: A Lot Help needed to walk in hospital room?: Total Help needed climbing 3-5 steps with a railing? : Total 6 Click Score: 10    End of Session Equipment Utilized During Treatment: Gait belt Activity Tolerance: Patient limited by fatigue;Patient limited by pain Patient left: in chair;with call bell/phone within reach;with chair alarm set;with family/visitor present Nurse Communication: Mobility status;Patient requests pain meds PT Visit Diagnosis: Other abnormalities of gait and mobility (R26.89);Difficulty in walking, not elsewhere classified (R26.2)     Time: 7829-5621 PT Time Calculation (min) (ACUTE ONLY): 23 min  Charges:    $Therapeutic Activity: 23-37 mins PT General Charges $$ ACUTE PT VISIT: 1 Visit                   Danielle Dess, PTA 08/09/23, 12:47 PM

## 2023-08-09 NOTE — Progress Notes (Signed)
  Subjective: 3 Days Post-Op Procedure(s) (LRB): INTRAMEDULLARY (IM) NAIL INTERTROCHANTERIC (Left) Patient reports pain as mild.   Patient is well, and has had no acute complaints or problems Plan is to go Rehab after hospital stay. Negative for chest pain and shortness of breath Fever: no Gastrointestinal: Negative for nausea and vomiting  Objective: Vital signs in last 24 hours: Temp:  [97.8 F (36.6 C)-99 F (37.2 C)] 98.6 F (37 C) (09/09 0000) Pulse Rate:  [82-107] 88 (09/09 0000) Resp:  [14-24] 17 (09/09 0000) BP: (128-139)/(49-98) 136/55 (09/09 0000) SpO2:  [98 %-100 %] 99 % (09/09 0000)  Intake/Output from previous day:  Intake/Output Summary (Last 24 hours) at 08/09/2023 0706 Last data filed at 08/09/2023 0500 Gross per 24 hour  Intake 2602 ml  Output 600 ml  Net 2002 ml    Intake/Output this shift: No intake/output data recorded.  Labs: Recent Labs    08/07/23 0530 08/08/23 0331 08/08/23 1520 08/09/23 0438  HGB 8.1* 7.8* 9.8* 8.2*   Recent Labs    08/08/23 0331 08/08/23 1520 08/09/23 0438  WBC 12.8*  --  13.2*  RBC 2.56*  --  2.66*  HCT 23.6* 30.3* 24.7*  PLT 201  --  230   Recent Labs    08/08/23 0331 08/09/23 0438  NA 135 136  K 3.9 3.5  CL 103 107  CO2 24 25  BUN 22 20  CREATININE 0.76 0.67  GLUCOSE 100* 96  CALCIUM 7.6* 7.8*   No results for input(s): "LABPT", "INR" in the last 72 hours.    EXAM General - Patient is Alert and Oriented Extremity - Neurovascular intact Sensation intact distally Dorsiflexion/Plantar flexion intact Compartment soft Dressing/Incision - clean, dry, and intact at this time.  Dressing change last night.  Saturation to the dressing last night. Motor Function - intact, moving foot and toes well on exam.  Minimal ambulation of 2 feet  History reviewed. No pertinent past medical history.  Assessment/Plan: 3 Days Post-Op Procedure(s) (LRB): INTRAMEDULLARY (IM) NAIL INTERTROCHANTERIC (Left) Principal  Problem:   Closed left subtrochanteric femur fracture (HCC) Active Problems:   Dementia without behavioral disturbance (HCC)   Leucocytosis   Essential hypertension   Hyperlipidemia   GERD (gastroesophageal reflux disease)   Acute postoperative anemia due to expected blood loss  Estimated body mass index is 25.88 kg/m as calculated from the following:   Height as of this encounter: 5\' 8"  (1.727 m).   Weight as of this encounter: 77.2 kg. Advance diet Up with therapy  Acute blood loss anemia.  Hemoglobin 8.2.  PRBC transfusion given  Discharge planning: Plan to follow-up at [facility] clinic orthopedics in 2 weeks for staple removal and x-rays.  Plan to discharge to rehab facility.  Dressing changes needed.  DVT Prophylaxis - Lovenox, Foot Pumps, and TED hose Weight-Bearing as tolerated to left leg  Dedra Skeens, PA-C Orthopaedic Surgery 08/09/2023, 7:06 AM

## 2023-08-09 NOTE — Care Management Important Message (Signed)
Important Message  Patient Details  Name: Leah Mitchell MRN: [id number] Date of Birth: [birth year]   Medicare Important Message Given:  N/A - LOS <3 / Initial given by admissions     [name] A [name] 08/09/2023, 1:13 PM

## 2023-08-09 NOTE — Progress Notes (Signed)
Progress Note   [name] WGN:[id number] DOB: 04/11/1946 DOA: 08/04/2023     5 DOS: the patient was seen and examined on 08/09/2023   Brief hospital course: Taken from H&P.   Leah Mitchell is a 77 y.o. female with medical history significant of dementia, suspected hypertension but normal recorded who was brought in to the ER after sustaining a mechanical fall. She was found to have left femoral fracture. Patient otherwise hemodynamically stable. Orthopedics consulted and Dr. Allena Katz plans repair for hip fracture tomorrow.   9/5: Slight decrease in hemoglobin today.  Awaiting surgery.  Switched to Dilaudid as morphine was not very helpful for pain.  9/6: Surgery got postponed yesterday for today.  Further decrease in hemoglobin to 10.3, all cell lines decreased so some dilutional effect with IV fluid.  Having right knee pain, imaging with arthritis.  9/7: S/p ORIF on 08/06/2023, tolerated the procedure well.  She will be weightbearing as tolerated per orthopedic surgery.  Hemoglobin decreased to 8.1 postsurgically, increasing the dose of iron supplement to twice daily, patient had a vasovagal syncopal episode, feeling very weak and pale day-ordered 1 unit of PRBC.  9/8: Vital stable, unable to void after removal of Foley yesterday, Foley was replaced after doing 1 time in and out cath.  Hemoglobin at 7.8 from 8.1 even with 1 unit of PRBC, some bleeding from the surgical site.  Ordered 1 more unit.  Mild hypocalcemia-starting on p.o. supplement and checking vitamin D level. PT is recommending SNF.  9/9: Hemodynamically stable, no obvious bleeding but hemoglobin with some decreased to 8.2 after some improvement with a unit of blood.  Total received 2 units of PRBC so far. TOC is working on placement  Assessment and Plan: * Closed left subtrochanteric femur fracture (HCC) Secondary to mechanical fall.  S/p ORIF on 08/06/2023 -Continue with supportive care and pain management -PT and OT  evaluation  Acute postoperative anemia due to expected blood loss Hemoglobin 8.2, received total of 2 unit of PRBC, bleeding from surgical site.  Fracture and surgery is also contributory -Continue to monitor -Increasing the dose of iron supplement  Leucocytosis Likely reactive with this recent fracture and fall.  Slowly improving -Continue to monitor  Essential hypertension -Continue home amlodipine  -As needed labetalol  Dementia without behavioral disturbance (HCC) -Continue home Aricept -Delirium precautions  Hyperlipidemia -Continue home Lipitor  GERD (gastroesophageal reflux disease) -Continue PPI   Subjective: Patient continued to feel weak when seen today.  Pain seems well-controlled.  Physical Exam: Vitals:   08/08/23 1429 08/08/23 1554 08/09/23 0000 08/09/23 0730  BP: (!) 134/57 (!) 139/49 (!) 136/55 (!) 138/57  Pulse: 89 92 88 83  Resp: 16 14 17 18   Temp: 97.9 F (36.6 C) 99 F (37.2 C) 98.6 F (37 C) 98.5 F (36.9 C)  TempSrc: Oral  Oral   SpO2:  99% 99% 94%  Weight:      Height:       General.  Frail elderly lady, in no acute distress. Pulmonary.  Lungs clear bilaterally, normal respiratory effort. CV.  Regular rate and rhythm, no JVD, rub or murmur. Abdomen.  Soft, nontender, nondistended, BS positive. CNS.  Alert and oriented .  No focal neurologic deficit. Extremities.  No edema, no cyanosis, pulses intact and symmetrical.    Data Reviewed: Prior data reviewed  Family Communication: Discussed with husband at bedside  Disposition: Status is: Inpatient Remains inpatient appropriate because: Severity of illness  Planned Discharge Destination:  To be decided  Time spent: 42 minutes  This record has been created using Conservation officer, historic buildings. Errors have been sought and corrected,but may not always be located. Such creation errors do not reflect on the standard of care.   Author: Arnetha Courser, MD 08/09/2023 4:48 PM  For on call  review [email or web address].

## 2023-08-09 NOTE — TOC Progression Note (Signed)
Transition of Care [facility]) - Progression Note    Patient Details  Name: Vania Budhram MRN: [id number] Date of Birth: 28-May-1946  [facility] CM/SW Contact  Marlowe Sax, RN Phone Number: 08/09/2023, 3:34 PM  Clinical Narrative:    Bobby the Spouse is not available, left a General VM   I called Vonda her daughter, They accepted the bed offer at [facility]     Expected Discharge Plan:  (TBD) Barriers to Discharge: Continued Medical Work up  Expected Discharge Plan and Services   Discharge Planning Services: CM Consult   Living arrangements for the past 2 months: Single Family Home                                       Social Determinants of Health (SDOH) Interventions    Readmission Risk Interventions     No data to display

## 2023-08-10 ENCOUNTER — Inpatient Hospital Stay: Payer: Medicare Other

## 2023-08-10 DIAGNOSIS — D72829 Elevated white blood cell count, unspecified: Secondary | ICD-10-CM | POA: Diagnosis not present

## 2023-08-10 DIAGNOSIS — S7222XA Displaced subtrochanteric fracture of left femur, initial encounter for closed fracture: Secondary | ICD-10-CM | POA: Diagnosis not present

## 2023-08-10 DIAGNOSIS — I10 Essential (primary) hypertension: Secondary | ICD-10-CM | POA: Diagnosis not present

## 2023-08-10 DIAGNOSIS — F0390 Unspecified dementia without behavioral disturbance: Secondary | ICD-10-CM | POA: Diagnosis not present

## 2023-08-10 LAB — CBC
HCT: 26.3 % — ABNORMAL LOW (ref 36.0–46.0)
Hemoglobin: 8.4 g/dL — ABNORMAL LOW (ref 12.0–15.0)
MCH: 31 pg (ref 26.0–34.0)
MCHC: 31.9 g/dL (ref 30.0–36.0)
MCV: 97 fL (ref 80.0–100.0)
Platelets: 277 10*3/uL (ref 150–400)
RBC: 2.71 MIL/uL — ABNORMAL LOW (ref 3.87–5.11)
RDW: 14.3 % (ref 11.5–15.5)
WBC: 10.3 10*3/uL (ref 4.0–10.5)
nRBC: 0.2 % (ref 0.0–0.2)

## 2023-08-10 NOTE — Progress Notes (Signed)
   08/10/23 1500  Spiritual Encounters  Type of Visit Initial  Care provided to: Pt and family  Referral source Chaplain assessment  Reason for visit Routine spiritual support  OnCall Visit No   Chaplain visited with patient and family. Provided compassionate care and prayer. Chaplain spiritual support remains available as the need arises.

## 2023-08-10 NOTE — Care Management Important Message (Signed)
Important Message  Patient Details  Name: Leah Mitchell MRN: [id number] Date of Birth: 1945/12/24   Medicare Important Message Given:  Yes     [name] 08/10/2023, 11:12 AM

## 2023-08-10 NOTE — Progress Notes (Addendum)
Physical Therapy Treatment Patient Details Name: Leah Mitchell MRN: [id number] DOB: 1946-07-02 Today's Date: 08/10/2023   History of Present Illness Patient is a 77 year old female with fall at home, found to have left reverse obliquity intertrochanteric hip fracture. s/p intramedullary nailing of left femur    PT Comments  Approached pt for PT session.  She reports feeling cold so blanket left on for LLE AAROM  upon removing blanket LLE dressing is saturated with drainage and LLE has increased edema, warmth from hip to below knee.  Generally painful to light touch and occasional spams pains.  She has received pain meds prior.  RN called to look and dressing change completed and she has reached out to MD to check.  Deferred mobility at this time due to pain. Reached out to ortho MD.   If plan is discharge home, recommend the following: Two people to help with walking and/or transfers;A lot of help with bathing/dressing/bathroom;Assist for transportation;Help with stairs or ramp for entrance;Assistance with cooking/housework   Can travel by private vehicle        Equipment Recommendations  Rolling walker (2 wheels)    Recommendations for Other Services       Precautions / Restrictions Precautions Precautions: Fall Precaution Comments: monitor BP Restrictions Weight Bearing Restrictions: Yes LLE Weight Bearing: Weight bearing as tolerated     Mobility  Bed Mobility                    Transfers                        Ambulation/Gait                   Stairs             Wheelchair Mobility     Tilt Bed    Modified Rankin (Stroke Patients Only)       Balance                                            Cognition Arousal: Alert Behavior During Therapy: WFL for tasks assessed/performed Overall Cognitive Status: Within Functional Limits for tasks assessed                                           Exercises      General Comments        Pertinent Vitals/Pain Pain Assessment Pain Assessment: Faces Faces Pain Scale: Hurts worst Pain Location: L hip Pain Descriptors / Indicators: Aching, Sore, Grimacing Pain Intervention(s): Limited activity within patient's tolerance, Monitored during session, Repositioned    Home Living                          Prior Function            PT Goals (current goals can now be found in the care plan section) Progress towards PT goals: Not progressing toward goals - comment    Frequency    Min 1X/week      PT Plan      Co-evaluation              AM-PAC PT "6 Clicks" Mobility   Outcome Measure  Help  needed turning from your back to your side while in a flat bed without using bedrails?: A Lot Help needed moving from lying on your back to sitting on the side of a flat bed without using bedrails?: A Lot Help needed moving to and from a bed to a chair (including a wheelchair)?: A Lot Help needed standing up from a chair using your arms (e.g., wheelchair or bedside chair)?: A Lot Help needed to walk in hospital room?: Total Help needed climbing 3-5 steps with a railing? : Total 6 Click Score: 10    End of Session Equipment Utilized During Treatment: Gait belt Activity Tolerance: Patient limited by fatigue;Patient limited by pain Patient left: in chair;with call bell/phone within reach;with chair alarm set;with family/visitor present Nurse Communication: Mobility status;Patient requests pain meds PT Visit Diagnosis: Other abnormalities of gait and mobility (R26.89);Difficulty in walking, not elsewhere classified (R26.2)     Time: 1310-1320 PT Time Calculation (min) (ACUTE ONLY): 10 min  Charges:    $Therapeutic Activity: 8-22 mins PT General Charges $$ ACUTE PT VISIT: 1 Visit                    Danielle Dess, PTA 08/10/23, 1:41 PM

## 2023-08-10 NOTE — Progress Notes (Signed)
Progress Note   [name] WUJ:[id number] DOB: [birth year] DOA: 08/04/2023     6 DOS: the patient was seen and examined on 08/10/2023   Brief hospital course: Taken from H&P.   Leah Mitchell is a 77 y.o. female with medical history significant of dementia, suspected hypertension but normal recorded who was brought in to the ER after sustaining a mechanical fall. She was found to have left femoral fracture. Patient otherwise hemodynamically stable. Orthopedics consulted and Dr. Allena Katz plans repair for hip fracture tomorrow.   9/5: Slight decrease in hemoglobin today.  Awaiting surgery.  Switched to Dilaudid as morphine was not very helpful for pain.  9/6: Surgery got postponed yesterday for today.  Further decrease in hemoglobin to 10.3, all cell lines decreased so some dilutional effect with IV fluid.  Having right knee pain, imaging with arthritis.  9/7: S/p ORIF on 08/06/2023, tolerated the procedure well.  She will be weightbearing as tolerated per orthopedic surgery.  Hemoglobin decreased to 8.1 postsurgically, increasing the dose of iron supplement to twice daily, patient had a vasovagal syncopal episode, feeling very weak and pale day-ordered 1 unit of PRBC.  9/8: Vital stable, unable to void after removal of Foley yesterday, Foley was replaced after doing 1 time in and out cath.  Hemoglobin at 7.8 from 8.1 even with 1 unit of PRBC, some bleeding from the surgical site.  Ordered 1 more unit.  Mild hypocalcemia-starting on p.o. supplement and checking vitamin D level. PT is recommending SNF.  9/9: Hemodynamically stable, no obvious bleeding but hemoglobin with some decreased to 8.2 after some improvement with a unit of blood.  Total received 2 units of PRBC so far. TOC is working on placement.  9/10: Foley catheter removed to give her a voiding trial.  Hemoglobin now stable .  Concern of left lower extremity edema-ordered venous Doppler.  Awaiting SNF placement .  Assessment and  Plan: * Closed left subtrochanteric femur fracture (HCC) Secondary to mechanical fall.  S/p ORIF on 08/06/2023 -Continue with supportive care and pain management -PT and OT evaluation-recommending SNF  Acute postoperative anemia due to expected blood loss Hemoglobin 8.4 and stable, received total of 2 unit of PRBC, bleeding from surgical site.  Fracture and surgery is also contributory -Continue to monitor -Increasing the dose of iron supplement  Leucocytosis Likely reactive with this recent fracture and fall.  Improved -Continue to monitor  Essential hypertension -Continue home amlodipine  -As needed labetalol  Dementia without behavioral disturbance (HCC) -Continue home Aricept -Delirium precautions  Hyperlipidemia -Continue home Lipitor  GERD (gastroesophageal reflux disease) -Continue PPI   Subjective: Patient slowly improving.  Still having some left leg pain but stating that it is bearable.  Physical Exam: Vitals:   08/09/23 0730 08/09/23 1811 08/09/23 2325 08/10/23 1025  BP: (!) 138/57 (!) 147/61 (!) 139/51 (!) 155/76  Pulse: 83 91 83 96  Resp: 18 17 18 18   Temp: 98.5 F (36.9 C) 98.5 F (36.9 C) 98.2 F (36.8 C) 98.6 F (37 C)  TempSrc:      SpO2: 94% 96% 90% 97%  Weight:      Height:       General.  Frail elderly lady, in no acute distress. Pulmonary.  Lungs clear bilaterally, normal respiratory effort. CV.  Regular rate and rhythm, no JVD, rub or murmur. Abdomen.  Soft, nontender, nondistended, BS positive. CNS.  Alert and oriented .  No focal neurologic deficit. Extremities.  No edema, no cyanosis, pulses intact and symmetrical.  Psychiatry.  Judgment and insight appears normal.    Data Reviewed: Prior data reviewed  Family Communication: Discussed with husband at bedside  Disposition: Status is: Inpatient Remains inpatient appropriate because: Severity of illness  Planned Discharge Destination:  To be decided  Time spent: 40 minutes  This  record has been created using Conservation officer, historic buildings. Errors have been sought and corrected,but may not always be located. Such creation errors do not reflect on the standard of care.   Author: Arnetha Courser, MD 08/10/2023 1:24 PM  For on call review [email or web address].

## 2023-08-10 NOTE — Progress Notes (Signed)
OT Cancellation Note  Patient Details Name: Tawania Dimichele MRN: [id number] DOB: May 08, 1946   Cancelled Treatment:    Reason Eval/Treat Not Completed: Other (comment) (per nurse, waiting on doppler to rule out LEDVT. Will hold at this time and follow up as approrpiate.)  Oleta Mouse, OTD OTR/L  08/10/23, 1:35 PM

## 2023-08-11 ENCOUNTER — Encounter: Payer: Self-pay | Admitting: Orthopedic Surgery

## 2023-08-11 ENCOUNTER — Inpatient Hospital Stay: Payer: Medicare Other

## 2023-08-11 DIAGNOSIS — R300 Dysuria: Secondary | ICD-10-CM | POA: Diagnosis not present

## 2023-08-11 DIAGNOSIS — S72112A Displaced fracture of greater trochanter of left femur, initial encounter for closed fracture: Secondary | ICD-10-CM | POA: Diagnosis not present

## 2023-08-11 DIAGNOSIS — M25552 Pain in left hip: Secondary | ICD-10-CM | POA: Diagnosis not present

## 2023-08-11 DIAGNOSIS — R41841 Cognitive communication deficit: Secondary | ICD-10-CM | POA: Diagnosis not present

## 2023-08-11 DIAGNOSIS — S71002D Unspecified open wound, left hip, subsequent encounter: Secondary | ICD-10-CM | POA: Diagnosis not present

## 2023-08-11 DIAGNOSIS — D509 Iron deficiency anemia, unspecified: Secondary | ICD-10-CM | POA: Diagnosis not present

## 2023-08-11 DIAGNOSIS — R410 Disorientation, unspecified: Secondary | ICD-10-CM | POA: Diagnosis not present

## 2023-08-11 DIAGNOSIS — T8484XD Pain due to internal orthopedic prosthetic devices, implants and grafts, subsequent encounter: Secondary | ICD-10-CM | POA: Diagnosis not present

## 2023-08-11 DIAGNOSIS — Z7901 Long term (current) use of anticoagulants: Secondary | ICD-10-CM | POA: Diagnosis not present

## 2023-08-11 DIAGNOSIS — S7222XD Displaced subtrochanteric fracture of left femur, subsequent encounter for closed fracture with routine healing: Secondary | ICD-10-CM | POA: Diagnosis not present

## 2023-08-11 DIAGNOSIS — K219 Gastro-esophageal reflux disease without esophagitis: Secondary | ICD-10-CM | POA: Diagnosis not present

## 2023-08-11 DIAGNOSIS — D62 Acute posthemorrhagic anemia: Secondary | ICD-10-CM | POA: Diagnosis not present

## 2023-08-11 DIAGNOSIS — S71012A Laceration without foreign body, left hip, initial encounter: Secondary | ICD-10-CM | POA: Diagnosis not present

## 2023-08-11 DIAGNOSIS — Z4789 Encounter for other orthopedic aftercare: Secondary | ICD-10-CM | POA: Diagnosis not present

## 2023-08-11 DIAGNOSIS — S7222XA Displaced subtrochanteric fracture of left femur, initial encounter for closed fracture: Secondary | ICD-10-CM | POA: Diagnosis not present

## 2023-08-11 DIAGNOSIS — I959 Hypotension, unspecified: Secondary | ICD-10-CM | POA: Diagnosis not present

## 2023-08-11 DIAGNOSIS — R600 Localized edema: Secondary | ICD-10-CM | POA: Diagnosis not present

## 2023-08-11 DIAGNOSIS — M1711 Unilateral primary osteoarthritis, right knee: Secondary | ICD-10-CM | POA: Diagnosis not present

## 2023-08-11 DIAGNOSIS — D649 Anemia, unspecified: Secondary | ICD-10-CM | POA: Diagnosis not present

## 2023-08-11 DIAGNOSIS — D508 Other iron deficiency anemias: Secondary | ICD-10-CM | POA: Diagnosis not present

## 2023-08-11 DIAGNOSIS — D72829 Elevated white blood cell count, unspecified: Secondary | ICD-10-CM | POA: Diagnosis not present

## 2023-08-11 DIAGNOSIS — M6281 Muscle weakness (generalized): Secondary | ICD-10-CM | POA: Diagnosis not present

## 2023-08-11 DIAGNOSIS — I10 Essential (primary) hypertension: Secondary | ICD-10-CM | POA: Diagnosis not present

## 2023-08-11 DIAGNOSIS — N390 Urinary tract infection, site not specified: Secondary | ICD-10-CM | POA: Diagnosis not present

## 2023-08-11 DIAGNOSIS — F0390 Unspecified dementia without behavioral disturbance: Secondary | ICD-10-CM | POA: Diagnosis not present

## 2023-08-11 DIAGNOSIS — E785 Hyperlipidemia, unspecified: Secondary | ICD-10-CM | POA: Diagnosis not present

## 2023-08-11 DIAGNOSIS — Z743 Need for continuous supervision: Secondary | ICD-10-CM | POA: Diagnosis not present

## 2023-08-11 DIAGNOSIS — R5381 Other malaise: Secondary | ICD-10-CM | POA: Diagnosis not present

## 2023-08-11 MED ORDER — RIVAROXABAN 10 MG PO TABS
10.0000 mg | ORAL_TABLET | Freq: Every day | ORAL | Status: DC
  Administered 2023-08-11: 10 mg via ORAL
  Filled 2023-08-11: qty 1

## 2023-08-11 MED ORDER — DOCUSATE SODIUM 100 MG PO CAPS: 100.0000 mg | ORAL_CAPSULE | Freq: Two times a day (BID) | ORAL | Status: AC

## 2023-08-11 MED ORDER — LIDOCAINE 5 % EX PTCH: 1.0000 | MEDICATED_PATCH | CUTANEOUS | Status: AC

## 2023-08-11 MED ORDER — FE FUM-VIT C-VIT B12-FA 460-60-0.01-1 MG PO CAPS: 1.0000 | ORAL_CAPSULE | Freq: Two times a day (BID) | ORAL | Status: AC

## 2023-08-11 MED ORDER — CALCIUM CARBONATE 1250 (500 CA) MG PO TABS: 1.0000 | ORAL_TABLET | Freq: Two times a day (BID) | ORAL | Status: AC

## 2023-08-11 MED ORDER — METHOCARBAMOL 500 MG PO TABS: 500.0000 mg | ORAL_TABLET | Freq: Four times a day (QID) | ORAL | Status: AC | PRN

## 2023-08-11 MED ORDER — RIVAROXABAN 10 MG PO TABS: 10.0000 mg | ORAL_TABLET | Freq: Every day | ORAL | Status: AC

## 2023-08-11 MED ORDER — BISACODYL 10 MG RE SUPP: 10.0000 mg | Freq: Every day | RECTAL | Status: AC | PRN

## 2023-08-11 MED ORDER — ONDANSETRON HCL 4 MG PO TABS: 4.0000 mg | ORAL_TABLET | Freq: Four times a day (QID) | ORAL | Status: AC | PRN

## 2023-08-11 NOTE — Plan of Care (Signed)

## 2023-08-11 NOTE — TOC Progression Note (Addendum)
Transition of Care [facility]) - Progression Note    Patient Details  Name: Leah Mitchell MRN: [id number] Date of Birth: [birth year]  [facility]) CM/SW Contact  Marlowe Sax, RN Phone Number: 08/11/2023, 2:59 PM  Clinical Narrative:     Going to room 68B at [facility] called to transport she is number 4 on the list, Husband Reita Cliche is in the room and made aware  Expected Discharge Plan:  (TBD) Barriers to Discharge: Continued Medical Work up  Expected Discharge Plan and Services   Discharge Planning Services: CM Consult   Living arrangements for the past 2 months: Single Family Home Expected Discharge Date: 08/11/23                                     Social Determinants of Health (SDOH) Interventions    Readmission Risk Interventions     No data to display

## 2023-08-11 NOTE — TOC Progression Note (Addendum)
Transition of Care [facility]) - Progression Note    Patient Details  Name: Leah Mitchell MRN: [id number] Date of Birth: 1946/02/13  Transition of Care [facility]) CM/SW Contact  Marlowe Sax, RN Phone Number: 08/11/2023, 8:53 AM  Clinical Narrative:     Reached out to inquire if the facility [facility] can accept the patient today Leah Mitchell stated that they are able to accept the patient today  Expected Discharge Plan:  (TBD) Barriers to Discharge: Continued Medical Work up  Expected Discharge Plan and Services   Discharge Planning Services: CM Consult   Living arrangements for the past 2 months: Single Family Home                                       Social Determinants of Health (SDOH) Interventions    Readmission Risk Interventions     No data to display

## 2023-08-11 NOTE — Discharge Summary (Signed)
Physician Discharge Summary   [name] MRN: [id number] DOB: 1946/06/08  Admit date:     08/04/2023  Discharge date: 08/11/23  Discharge Physician: Arnetha Courser   PCP: Ailene Ravel, MD   Recommendations at discharge:  Please obtain CBC and BMP in 1 week Patient is being discharged on low-dose Xarelto as DVT prophylaxis for 4 weeks after the hip surgery. Follow-up with orthopedic surgery Follow-up with primary care provider  Discharge Diagnoses: Principal Problem:   Closed left subtrochanteric femur fracture (HCC) Active Problems:   Leucocytosis   Acute postoperative anemia due to expected blood loss   Essential hypertension   Dementia without behavioral disturbance (HCC)   Hyperlipidemia   GERD (gastroesophageal reflux disease)   Hospital Course: Taken from H&P.   Leah Mitchell is a 77 y.o. female with medical history significant of dementia, suspected hypertension but normal recorded who was brought in to the ER after sustaining a mechanical fall. She was found to have left femoral fracture. Patient otherwise hemodynamically stable. Orthopedics consulted and Dr. Allena Katz plans repair for hip fracture tomorrow.   9/5: Slight decrease in hemoglobin today.  Awaiting surgery.  Switched to Dilaudid as morphine was not very helpful for pain.  9/6: Surgery got postponed yesterday for today.  Further decrease in hemoglobin to 10.3, all cell lines decreased so some dilutional effect with IV fluid.  Having right knee pain, imaging with arthritis.  9/7: S/p ORIF on 08/06/2023, tolerated the procedure well.  She will be weightbearing as tolerated per orthopedic surgery.  Hemoglobin decreased to 8.1 postsurgically, increasing the dose of iron supplement to twice daily, patient had a vasovagal syncopal episode, feeling very weak and pale day-ordered 1 unit of PRBC.  9/8: Vital stable, unable to void after removal of Foley yesterday, Foley was replaced after doing 1 time in and  out cath.  Hemoglobin at 7.8 from 8.1 even with 1 unit of PRBC, some bleeding from the surgical site.  Ordered 1 more unit.  Mild hypocalcemia-starting on p.o. supplement and checking vitamin D level. PT is recommending SNF.  9/9: Hemodynamically stable, no obvious bleeding but hemoglobin with some decreased to 8.2 after some improvement with a unit of blood.  Total received 2 units of PRBC so far. TOC is working on placement.  9/10: Foley catheter removed to give her a voiding trial.  Hemoglobin now stable .  Concern of left lower extremity edema-ordered venous Doppler.  Awaiting SNF placement .  9/11: Hemodynamically stable.  Able to void after removal of Foley catheter.  Left lower extremity venous Doppler was negative for DVT.  She was started on Xarelto as DVT prophylaxis which she will take for 4 weeks.  Patient is being discharged to rehab for further management.  She will continue on current medications and follow-up with her providers for further recommendations.  Assessment and Plan: * Closed left subtrochanteric femur fracture (HCC) Secondary to mechanical fall.  S/p ORIF on 08/06/2023 -Continue with supportive care and pain management -PT and OT evaluation-recommending SNF  Acute postoperative anemia due to expected blood loss Hemoglobin 8.4 and stable, received total of 2 unit of PRBC, bleeding from surgical site.  Fracture and surgery is also contributory -Continue to monitor -Increasing the dose of iron supplement  Leucocytosis Likely reactive with this recent fracture and fall.  Improved -Continue to monitor  Essential hypertension -Continue home amlodipine  -As needed labetalol  Dementia without behavioral disturbance (HCC) -Continue home Aricept -Delirium precautions  Hyperlipidemia -Continue home Lipitor  GERD (gastroesophageal reflux disease) -Continue PPI   Consultants: Orthopedic surgery Procedures performed: ORIF Disposition: Skilled nursing  facility Diet recommendation:  Discharge Diet Orders (From admission, onward)     Start     Ordered   08/11/23 0000  Diet - low sodium heart healthy        08/11/23 1434           Cardiac diet DISCHARGE MEDICATION: Allergies as of 08/11/2023       Reactions   Aspirin Other (See Comments)   Crohn's Disease   Prednisone Other (See Comments)   Crohn's Disease   Sulfa Antibiotics Other (See Comments)   Crohn's Disease        Medication List     TAKE these medications    amLODipine 2.5 MG tablet Commonly known as: NORVASC Take 2.5 mg by mouth daily.   atorvastatin 10 MG tablet Commonly known as: LIPITOR Take 10 mg by mouth daily.   bisacodyl 10 MG suppository Commonly known as: DULCOLAX Place 1 suppository (10 mg total) rectally daily as needed for moderate constipation.   calcium carbonate 1250 (500 Ca) MG tablet Commonly known as: OS-CAL - dosed in mg of elemental calcium Take 1 tablet (1,250 mg total) by mouth 2 (two) times daily with a meal.   docusate sodium 100 MG capsule Commonly known as: COLACE Take 1 capsule (100 mg total) by mouth 2 (two) times daily.   donepezil 5 MG tablet Commonly known as: ARICEPT Take 5 mg by mouth at bedtime.   Fe Fum-Vit C-Vit B12-FA Caps capsule Commonly known as: TRIGELS-F FORTE Take 1 capsule by mouth 2 (two) times daily.   lidocaine 5 % Commonly known as: LIDODERM Place 1 patch onto the skin daily. Remove & Discard patch within 12 hours or as directed by MD   methocarbamol 500 MG tablet Commonly known as: ROBAXIN Take 1 tablet (500 mg total) by mouth every 6 (six) hours as needed for muscle spasms.   omeprazole 20 MG capsule Commonly known as: PRILOSEC Take 20 mg by mouth daily.   ondansetron 4 MG tablet Commonly known as: ZOFRAN Take 1 tablet (4 mg total) by mouth every 6 (six) hours as needed for nausea.   oxyCODONE 5 MG immediate release tablet Commonly known as: Oxy IR/ROXICODONE Take 0.5-1 tablets  (2.5-5 mg total) by mouth every 4 (four) hours as needed for moderate pain (pain score 4-6).   rivaroxaban 10 MG Tabs tablet Commonly known as: XARELTO Take 1 tablet (10 mg total) by mouth daily. Start taking on: August 12, 2023   traMADol 50 MG tablet Commonly known as: ULTRAM Take 1 tablet (50 mg total) by mouth every 6 (six) hours as needed for moderate pain.        Contact information for follow-up providers     Dedra Skeens, PA-C Follow up in 2 week(s).   Specialty: Orthopedic Surgery Why: For staple removal and x-rays of the left hip Contact information: [address] [place] [address] [phone]         Hamrick, Durward Fortes, MD. Schedule an appointment as soon as possible for a visit in 1 week(s).   Specialty: Family Medicine Contact information: [address] [place] [address] [phone]              Contact information for after-discharge care     Destination     [facility] .   Service: Skilled Nursing Contact information: [address]  [address] [place] [address] [phone]                    Discharge Exam: Ceasar Mons Weights   08/04/23 1612  Weight: 77.2 kg   General.  Frail lady, in no acute distress. Pulmonary.  Lungs clear bilaterally, normal respiratory effort. CV.  Regular rate and rhythm, no JVD, rub or murmur. Abdomen.  Soft, nontender, nondistended, BS positive. CNS.  Alert and oriented .  No focal neurologic deficit. Extremities.  No edema, no cyanosis, pulses intact and symmetrical.  Generalized edema of left leg, no tenderness Psychiatry.  Judgment and insight appears normal.   Condition at discharge: stable  The results of significant diagnostics from this hospitalization (including imaging, microbiology, ancillary and laboratory) are listed below for reference.   Imaging Studies: US Venous Img Lower Unilateral Left (DVT)  Result Date:  08/11/2023 CLINICAL DATA:  Left lower extremity edema EXAM: LEFT LOWER EXTREMITY VENOUS DOPPLER ULTRASOUND TECHNIQUE: Gray-scale sonography with compression, as well as color and duplex ultrasound, were performed to evaluate the deep venous system(s) from the level of the common femoral vein through the popliteal and proximal calf veins. COMPARISON:  None Available. FINDINGS: VENOUS Normal compressibility of the common femoral, superficial femoral, and popliteal veins, as well as the visualized calf veins. Visualized portions of profunda femoral vein and great saphenous vein unremarkable. No filling defects to suggest DVT on grayscale or color Doppler imaging. Doppler waveforms show normal direction of venous flow, normal respiratory plasticity and response to augmentation. Limited views of the contralateral common femoral vein are unremarkable. OTHER None. Limitations: none IMPRESSION: Negative. Electronically Signed   By: Malachy Moan M.D.   On: 08/11/2023 14:19   DG HIP UNILAT WITH PELVIS 2-3 VIEWS LEFT  Result Date: 08/06/2023 CLINICAL DATA:  Elective surgery EXAM: DG HIP (WITH OR WITHOUT PELVIS) 2-3V LEFT COMPARISON:  Left hip x-ray 08/04/2023 FINDINGS: Seven intraoperative fluoroscopic views of the left hip were submitted. Left hip screw and intramedullary nail were placed fixating intratrochanteric fracture. Alignment is anatomic. Fluoroscopy time: 6 minutes and 27 seconds. Fluoroscopy dose: 107.25 micro gray. IMPRESSION: Left hip screw and intramedullary nail were placed fixating intratrochanteric fracture. Alignment is anatomic. Electronically Signed   By: Darliss Cheney M.D.   On: 08/06/2023 21:44   DG C-Arm 1-60 Min-No Report  Result Date: 08/06/2023 Fluoroscopy was utilized by the requesting physician.  No radiographic interpretation.   DG C-Arm 1-60 Min-No Report  Result Date: 08/06/2023 Fluoroscopy was utilized by the requesting physician.  No radiographic interpretation.   DG C-Arm 1-60  Min-No Report  Result Date: 08/06/2023 Fluoroscopy was utilized by the requesting physician.  No radiographic interpretation.   DG Knee 1-2 Views Right  Result Date: 08/05/2023 CLINICAL DATA:  Right knee pain.  Fall yesterday. EXAM: RIGHT KNEE - 1-2 VIEW COMPARISON:  None Available. FINDINGS: No fracture or dislocation. Minor osteoarthritis. There is chondrocalcinosis. Small quadriceps tendon enthesophyte. No significant knee joint effusion. No focal soft tissue abnormalities. IMPRESSION: 1. No fracture or dislocation of the right knee. 2. Minor osteoarthritis and chondrocalcinosis. Electronically Signed   By: Narda Rutherford M.D.   On: 08/05/2023 22:07   DG Ankle 2 Views Right  Result Date: 08/05/2023 CLINICAL DATA:  Right knee and ankle pain.  Fall yesterday. EXAM: RIGHT ANKLE - 2 VIEW COMPARISON:  None Available. FINDINGS: There is no evidence of fracture, dislocation, or joint effusion. Ankle mortise is preserved. There is a small plantar calcaneal spur. Soft tissues are unremarkable.  IMPRESSION: No fracture or subluxation of the right ankle. Electronically Signed   By: Narda Rutherford M.D.   On: 08/05/2023 22:01   DG Chest 1 View  Result Date: 08/04/2023 CLINICAL DATA:  Left hip pain after fall, fracture. EXAM: CHEST  1 VIEW COMPARISON:  04/23/2020 FINDINGS: The cardiomediastinal contours are normal. Subsegmental scarring at the left lung base. Pulmonary vasculature is normal. No consolidation, pleural effusion, or pneumothorax. No acute osseous abnormalities are seen. IMPRESSION: No acute findings.  Subsegmental scarring at the left lung base. Electronically Signed   By: Narda Rutherford M.D.   On: 08/04/2023 17:45   CT Cervical Spine Wo Contrast  Result Date: 08/04/2023 CLINICAL DATA:  Neck trauma (Age >= 65y) Dementia patient post fall.  Pain. EXAM: CT CERVICAL SPINE WITHOUT CONTRAST TECHNIQUE: Multidetector CT imaging of the cervical spine was performed without intravenous contrast.  Multiplanar CT image reconstructions were also generated. RADIATION DOSE REDUCTION: This exam was performed according to the departmental dose-optimization program which includes automated exposure control, adjustment of the mA and/or kV according to patient size and/or use of iterative reconstruction technique. COMPARISON:  04/23/2020 FINDINGS: Alignment: No traumatic subluxation. Grade 1 degenerative anterolisthesis of C3 on C4, C4 on C5, and C7 on T1. Skull base and vertebrae: No acute fracture. Vertebral body heights are maintained. The dens and skull base are intact. Incomplete fusion posterior arch of C1 on the left, unchanged. Soft tissues and spinal canal: No prevertebral fluid or swelling. No visible canal hematoma. Disc levels: Mild diffuse degenerative disc disease and facet hypertrophy. Upper chest: No acute findings. Other: None. IMPRESSION: Mild degenerative change in the cervical spine without acute fracture or subluxation. Electronically Signed   By: Narda Rutherford M.D.   On: 08/04/2023 17:44   CT Head Wo Contrast  Result Date: 08/04/2023 CLINICAL DATA:  Head trauma, minor (Age >= 65y) pain. EXAM: CT HEAD WITHOUT CONTRAST TECHNIQUE: Contiguous axial images were obtained from the base of the skull through the vertex without intravenous contrast. RADIATION DOSE REDUCTION: This exam was performed according to the departmental dose-optimization program which includes automated exposure control, adjustment of the mA and/or kV according to patient size and/or use of iterative reconstruction technique. COMPARISON:  Head CT 09/16/2020 FINDINGS: Brain: No acute intracranial hemorrhage. No subdural or extra-axial collection. No hydrocephalus. Age related atrophy. Moderate periventricular and deep chronic small vessel ischemia. 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 Remote lacunar infarcts in the basal ganglia. No midline shift or mass lesion/mass effect. Vascular: Atherosclerosis of skullbase  vasculature without hyperdense vessel or abnormal calcification. Skull: No fracture or focal lesion. Sinuses/Orbits: No acute finding. Other: No large scalp hematoma. IMPRESSION: 1. No acute intracranial abnormality. No skull fracture. 2. Age related atrophy and chronic ischemic change. Electronically Signed   By: Narda Rutherford M.D.   On: 08/04/2023 17:40   DG Hip Unilat W or Wo Pelvis 2-3 Views Left  Result Date: 08/04/2023 CLINICAL DATA:  Left hip pain after fall. Left lower extremity foreshortening. EXAM: DG HIP (WITH OR WITHOUT PELVIS) 2-3V LEFT COMPARISON:  None Available. FINDINGS: Comminuted displaced left proximal femur fracture. There is seen intertrochanteric and vertical subtrochanteric component extending laterally. Apex lateral angulation. The femoral head remains seated in the acetabulum. Intact pubic rami and bony pelvis. 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 No pubic symphyseal or sacroiliac joint diastasis. Symphyseal chondrocalcinosis. IMPRESSION: Comminuted displaced and angulated left proximal femur fracture, intertrochanteric and subtrochanteric components. Electronically Signed   By: Ivette Loyal.D.  On: 08/04/2023 17:34    Microbiology: No results found for this or any previous visit.  Labs: CBC: Recent Labs  Lab 08/04/23 1635 08/05/23 0334 08/06/23 0407 08/07/23 0530 08/08/23 0331 08/08/23 1520 08/09/23 0438 08/10/23 0517  WBC 15.3*   < > 11.7* 11.1* 12.8*  --  13.2* 10.3  NEUTROABS 12.4*  --   --   --   --   --   --   --   HGB 12.4   < > 10.3* 8.1* 7.8* 9.8* 8.2* 8.4*  HCT 38.1   < > 31.8* 24.5* 23.6* 30.3* 24.7* 26.3*  MCV 94.5   < > 94.9 93.5 92.2  --  92.9 97.0  PLT 342   < > 267 199 201  --  230 277   < > = values in this interval not displayed.   Basic Metabolic Panel: Recent Labs  Lab 08/05/23 0334 08/06/23 0407 08/07/23 0530 08/08/23 0331 08/09/23 0438  NA 136 135 135 135 136  K 3.8 4.0 4.1 3.9 3.5  CL 103 104 103 103 107  CO2 22  24 25 24 25   GLUCOSE 108* 99 103* 100* 96  BUN 13 14 17 22 20   CREATININE 0.78 0.65 0.95 0.76 0.67  CALCIUM 8.7* 8.3* 7.8* 7.6* 7.8*   Liver Function Tests: No results for input(s): "AST", "ALT", "ALKPHOS", "BILITOT", "PROT", "ALBUMIN" in the last 168 hours. CBG: Recent Labs  Lab 08/07/23 1201  GLUCAP 89    Discharge time spent: greater than 30 minutes.  This record has been created using Conservation officer, historic buildings. Errors have been sought and corrected,but may not always be located. Such creation errors do not reflect on the standard of care.   Signed: Arnetha Courser, MD [facility] 08/11/2023

## 2023-08-11 NOTE — Progress Notes (Signed)
Telephone report given to Adella Nissen, RN at receiving facility; all questions answered.

## 2023-08-11 NOTE — Plan of Care (Signed)
  Problem: Clinical Measurements: Goal: Diagnostic test results will improve Outcome: Progressing Goal: Cardiovascular complication will be avoided Outcome: Progressing   Problem: Activity: Goal: Risk for activity intolerance will decrease Outcome: Progressing   Problem: Nutrition: Goal: Adequate nutrition will be maintained Outcome: Progressing   Problem: Coping: Goal: Level of anxiety will decrease Outcome: Progressing   Problem: Elimination: Goal: Will not experience complications related to bowel motility Outcome: Progressing Goal: Will not experience complications related to urinary retention Outcome: Progressing   Problem: Pain Managment: Goal: General experience of comfort will improve Outcome: Progressing

## 2023-08-12 DIAGNOSIS — S72112A Displaced fracture of greater trochanter of left femur, initial encounter for closed fracture: Secondary | ICD-10-CM | POA: Diagnosis not present

## 2023-08-12 DIAGNOSIS — M1711 Unilateral primary osteoarthritis, right knee: Secondary | ICD-10-CM | POA: Diagnosis not present

## 2023-08-12 DIAGNOSIS — F0390 Unspecified dementia without behavioral disturbance: Secondary | ICD-10-CM | POA: Diagnosis not present

## 2023-08-12 DIAGNOSIS — I10 Essential (primary) hypertension: Secondary | ICD-10-CM | POA: Diagnosis not present

## 2023-08-12 DIAGNOSIS — K219 Gastro-esophageal reflux disease without esophagitis: Secondary | ICD-10-CM | POA: Diagnosis not present

## 2023-08-12 DIAGNOSIS — S71012A Laceration without foreign body, left hip, initial encounter: Secondary | ICD-10-CM | POA: Diagnosis not present

## 2023-08-12 DIAGNOSIS — D509 Iron deficiency anemia, unspecified: Secondary | ICD-10-CM | POA: Diagnosis not present

## 2023-08-12 DIAGNOSIS — E785 Hyperlipidemia, unspecified: Secondary | ICD-10-CM | POA: Diagnosis not present

## 2023-08-12 DIAGNOSIS — S7222XD Displaced subtrochanteric fracture of left femur, subsequent encounter for closed fracture with routine healing: Secondary | ICD-10-CM | POA: Diagnosis not present

## 2023-08-13 DIAGNOSIS — S7222XD Displaced subtrochanteric fracture of left femur, subsequent encounter for closed fracture with routine healing: Secondary | ICD-10-CM | POA: Diagnosis not present

## 2023-08-13 DIAGNOSIS — I10 Essential (primary) hypertension: Secondary | ICD-10-CM | POA: Diagnosis not present

## 2023-08-13 DIAGNOSIS — D509 Iron deficiency anemia, unspecified: Secondary | ICD-10-CM | POA: Diagnosis not present

## 2023-08-16 DIAGNOSIS — I10 Essential (primary) hypertension: Secondary | ICD-10-CM | POA: Diagnosis not present

## 2023-08-16 DIAGNOSIS — S7222XD Displaced subtrochanteric fracture of left femur, subsequent encounter for closed fracture with routine healing: Secondary | ICD-10-CM | POA: Diagnosis not present

## 2023-08-16 DIAGNOSIS — R5381 Other malaise: Secondary | ICD-10-CM | POA: Diagnosis not present

## 2023-08-16 DIAGNOSIS — E785 Hyperlipidemia, unspecified: Secondary | ICD-10-CM | POA: Diagnosis not present

## 2023-08-17 DIAGNOSIS — D649 Anemia, unspecified: Secondary | ICD-10-CM | POA: Diagnosis not present

## 2023-08-18 DIAGNOSIS — K219 Gastro-esophageal reflux disease without esophagitis: Secondary | ICD-10-CM | POA: Diagnosis not present

## 2023-08-19 DIAGNOSIS — K219 Gastro-esophageal reflux disease without esophagitis: Secondary | ICD-10-CM | POA: Diagnosis not present

## 2023-08-19 DIAGNOSIS — R410 Disorientation, unspecified: Secondary | ICD-10-CM | POA: Diagnosis not present

## 2023-08-19 DIAGNOSIS — I10 Essential (primary) hypertension: Secondary | ICD-10-CM | POA: Diagnosis not present

## 2023-08-19 DIAGNOSIS — S71002D Unspecified open wound, left hip, subsequent encounter: Secondary | ICD-10-CM | POA: Diagnosis not present

## 2023-08-19 DIAGNOSIS — E785 Hyperlipidemia, unspecified: Secondary | ICD-10-CM | POA: Diagnosis not present

## 2023-08-20 DIAGNOSIS — E785 Hyperlipidemia, unspecified: Secondary | ICD-10-CM | POA: Diagnosis not present

## 2023-08-24 ENCOUNTER — Encounter: Payer: Self-pay | Admitting: Orthopedic Surgery

## 2023-08-24 DIAGNOSIS — S7222XD Displaced subtrochanteric fracture of left femur, subsequent encounter for closed fracture with routine healing: Secondary | ICD-10-CM | POA: Diagnosis not present

## 2023-08-24 DIAGNOSIS — R300 Dysuria: Secondary | ICD-10-CM | POA: Diagnosis not present

## 2023-08-24 DIAGNOSIS — I10 Essential (primary) hypertension: Secondary | ICD-10-CM | POA: Diagnosis not present

## 2023-08-25 DIAGNOSIS — M25552 Pain in left hip: Secondary | ICD-10-CM | POA: Diagnosis not present

## 2023-08-27 DIAGNOSIS — I10 Essential (primary) hypertension: Secondary | ICD-10-CM | POA: Diagnosis not present

## 2023-08-27 DIAGNOSIS — S7222XD Displaced subtrochanteric fracture of left femur, subsequent encounter for closed fracture with routine healing: Secondary | ICD-10-CM | POA: Diagnosis not present

## 2023-08-30 DIAGNOSIS — I10 Essential (primary) hypertension: Secondary | ICD-10-CM | POA: Diagnosis not present

## 2023-08-30 DIAGNOSIS — S7222XD Displaced subtrochanteric fracture of left femur, subsequent encounter for closed fracture with routine healing: Secondary | ICD-10-CM | POA: Diagnosis not present

## 2023-08-30 DIAGNOSIS — F0390 Unspecified dementia without behavioral disturbance: Secondary | ICD-10-CM | POA: Diagnosis not present

## 2023-08-30 DIAGNOSIS — N390 Urinary tract infection, site not specified: Secondary | ICD-10-CM | POA: Diagnosis not present

## 2023-08-30 DIAGNOSIS — S72112A Displaced fracture of greater trochanter of left femur, initial encounter for closed fracture: Secondary | ICD-10-CM | POA: Diagnosis not present

## 2023-08-30 DIAGNOSIS — E785 Hyperlipidemia, unspecified: Secondary | ICD-10-CM | POA: Diagnosis not present

## 2023-08-30 DIAGNOSIS — Z7901 Long term (current) use of anticoagulants: Secondary | ICD-10-CM | POA: Diagnosis not present

## 2023-08-31 DIAGNOSIS — I10 Essential (primary) hypertension: Secondary | ICD-10-CM | POA: Diagnosis not present

## 2023-08-31 DIAGNOSIS — F0390 Unspecified dementia without behavioral disturbance: Secondary | ICD-10-CM | POA: Diagnosis not present

## 2023-08-31 DIAGNOSIS — E785 Hyperlipidemia, unspecified: Secondary | ICD-10-CM | POA: Diagnosis not present

## 2023-08-31 DIAGNOSIS — N390 Urinary tract infection, site not specified: Secondary | ICD-10-CM | POA: Diagnosis not present

## 2023-09-01 DIAGNOSIS — I10 Essential (primary) hypertension: Secondary | ICD-10-CM | POA: Diagnosis not present

## 2023-09-01 DIAGNOSIS — S7222XD Displaced subtrochanteric fracture of left femur, subsequent encounter for closed fracture with routine healing: Secondary | ICD-10-CM | POA: Diagnosis not present

## 2023-09-02 DIAGNOSIS — I10 Essential (primary) hypertension: Secondary | ICD-10-CM | POA: Diagnosis not present

## 2023-09-02 DIAGNOSIS — K219 Gastro-esophageal reflux disease without esophagitis: Secondary | ICD-10-CM | POA: Diagnosis not present

## 2023-09-02 DIAGNOSIS — S72112A Displaced fracture of greater trochanter of left femur, initial encounter for closed fracture: Secondary | ICD-10-CM | POA: Diagnosis not present

## 2023-09-02 DIAGNOSIS — E785 Hyperlipidemia, unspecified: Secondary | ICD-10-CM | POA: Diagnosis not present

## 2023-09-02 DIAGNOSIS — N390 Urinary tract infection, site not specified: Secondary | ICD-10-CM | POA: Diagnosis not present

## 2023-09-02 DIAGNOSIS — F0390 Unspecified dementia without behavioral disturbance: Secondary | ICD-10-CM | POA: Diagnosis not present

## 2023-09-02 DIAGNOSIS — Z7901 Long term (current) use of anticoagulants: Secondary | ICD-10-CM | POA: Diagnosis not present

## 2023-09-02 DIAGNOSIS — S71002D Unspecified open wound, left hip, subsequent encounter: Secondary | ICD-10-CM | POA: Diagnosis not present

## 2023-09-06 DIAGNOSIS — E785 Hyperlipidemia, unspecified: Secondary | ICD-10-CM | POA: Diagnosis not present

## 2023-09-06 DIAGNOSIS — Z96651 Presence of right artificial knee joint: Secondary | ICD-10-CM | POA: Diagnosis not present

## 2023-09-06 DIAGNOSIS — Z79899 Other long term (current) drug therapy: Secondary | ICD-10-CM | POA: Diagnosis not present

## 2023-09-06 DIAGNOSIS — N390 Urinary tract infection, site not specified: Secondary | ICD-10-CM | POA: Diagnosis not present

## 2023-09-06 DIAGNOSIS — K5090 Crohn's disease, unspecified, without complications: Secondary | ICD-10-CM | POA: Diagnosis not present

## 2023-09-06 DIAGNOSIS — Z7901 Long term (current) use of anticoagulants: Secondary | ICD-10-CM | POA: Diagnosis not present

## 2023-09-06 DIAGNOSIS — I10 Essential (primary) hypertension: Secondary | ICD-10-CM | POA: Diagnosis not present

## 2023-09-06 DIAGNOSIS — R41841 Cognitive communication deficit: Secondary | ICD-10-CM | POA: Diagnosis not present

## 2023-09-06 DIAGNOSIS — Z9089 Acquired absence of other organs: Secondary | ICD-10-CM | POA: Diagnosis not present

## 2023-09-06 DIAGNOSIS — F0390 Unspecified dementia without behavioral disturbance: Secondary | ICD-10-CM | POA: Diagnosis not present

## 2023-09-06 DIAGNOSIS — K219 Gastro-esophageal reflux disease without esophagitis: Secondary | ICD-10-CM | POA: Diagnosis not present

## 2023-09-06 DIAGNOSIS — S72112D Displaced fracture of greater trochanter of left femur, subsequent encounter for closed fracture with routine healing: Secondary | ICD-10-CM | POA: Diagnosis not present

## 2023-09-06 DIAGNOSIS — R32 Unspecified urinary incontinence: Secondary | ICD-10-CM | POA: Diagnosis not present

## 2023-09-06 DIAGNOSIS — S7222XD Displaced subtrochanteric fracture of left femur, subsequent encounter for closed fracture with routine healing: Secondary | ICD-10-CM | POA: Diagnosis not present

## 2023-09-06 DIAGNOSIS — R600 Localized edema: Secondary | ICD-10-CM | POA: Diagnosis not present

## 2023-09-06 DIAGNOSIS — D62 Acute posthemorrhagic anemia: Secondary | ICD-10-CM | POA: Diagnosis not present

## 2023-09-08 DIAGNOSIS — Z23 Encounter for immunization: Secondary | ICD-10-CM | POA: Diagnosis not present

## 2023-09-09 DIAGNOSIS — K5090 Crohn's disease, unspecified, without complications: Secondary | ICD-10-CM | POA: Diagnosis not present

## 2023-09-09 DIAGNOSIS — S7222XD Displaced subtrochanteric fracture of left femur, subsequent encounter for closed fracture with routine healing: Secondary | ICD-10-CM | POA: Diagnosis not present

## 2023-09-09 DIAGNOSIS — S72112D Displaced fracture of greater trochanter of left femur, subsequent encounter for closed fracture with routine healing: Secondary | ICD-10-CM | POA: Diagnosis not present

## 2023-09-09 DIAGNOSIS — N390 Urinary tract infection, site not specified: Secondary | ICD-10-CM | POA: Diagnosis not present

## 2023-09-09 DIAGNOSIS — F0390 Unspecified dementia without behavioral disturbance: Secondary | ICD-10-CM | POA: Diagnosis not present

## 2023-09-09 DIAGNOSIS — I10 Essential (primary) hypertension: Secondary | ICD-10-CM | POA: Diagnosis not present

## 2023-09-21 DIAGNOSIS — W19XXXD Unspecified fall, subsequent encounter: Secondary | ICD-10-CM | POA: Diagnosis not present

## 2023-09-21 DIAGNOSIS — F0390 Unspecified dementia without behavioral disturbance: Secondary | ICD-10-CM | POA: Diagnosis not present

## 2023-09-21 DIAGNOSIS — Z9181 History of falling: Secondary | ICD-10-CM | POA: Diagnosis not present

## 2023-09-21 DIAGNOSIS — E785 Hyperlipidemia, unspecified: Secondary | ICD-10-CM | POA: Diagnosis not present

## 2023-09-21 DIAGNOSIS — K5090 Crohn's disease, unspecified, without complications: Secondary | ICD-10-CM | POA: Diagnosis not present

## 2023-09-21 DIAGNOSIS — S7222XD Displaced subtrochanteric fracture of left femur, subsequent encounter for closed fracture with routine healing: Secondary | ICD-10-CM | POA: Diagnosis not present

## 2023-09-21 DIAGNOSIS — Z87891 Personal history of nicotine dependence: Secondary | ICD-10-CM | POA: Diagnosis not present

## 2023-09-22 DIAGNOSIS — R0781 Pleurodynia: Secondary | ICD-10-CM | POA: Diagnosis not present

## 2023-09-22 DIAGNOSIS — Z9889 Other specified postprocedural states: Secondary | ICD-10-CM | POA: Diagnosis not present

## 2023-09-22 DIAGNOSIS — Z8781 Personal history of (healed) traumatic fracture: Secondary | ICD-10-CM | POA: Diagnosis not present

## 2023-09-23 DIAGNOSIS — K5090 Crohn's disease, unspecified, without complications: Secondary | ICD-10-CM | POA: Diagnosis not present

## 2023-09-23 DIAGNOSIS — Z87891 Personal history of nicotine dependence: Secondary | ICD-10-CM | POA: Diagnosis not present

## 2023-09-23 DIAGNOSIS — F0390 Unspecified dementia without behavioral disturbance: Secondary | ICD-10-CM | POA: Diagnosis not present

## 2023-09-23 DIAGNOSIS — E785 Hyperlipidemia, unspecified: Secondary | ICD-10-CM | POA: Diagnosis not present

## 2023-09-23 DIAGNOSIS — M810 Age-related osteoporosis without current pathological fracture: Secondary | ICD-10-CM | POA: Diagnosis not present

## 2023-09-23 DIAGNOSIS — W19XXXD Unspecified fall, subsequent encounter: Secondary | ICD-10-CM | POA: Diagnosis not present

## 2023-09-23 DIAGNOSIS — D649 Anemia, unspecified: Secondary | ICD-10-CM | POA: Diagnosis not present

## 2023-09-23 DIAGNOSIS — S7222XD Displaced subtrochanteric fracture of left femur, subsequent encounter for closed fracture with routine healing: Secondary | ICD-10-CM | POA: Diagnosis not present

## 2023-09-27 DIAGNOSIS — K5090 Crohn's disease, unspecified, without complications: Secondary | ICD-10-CM | POA: Diagnosis not present

## 2023-09-27 DIAGNOSIS — F0390 Unspecified dementia without behavioral disturbance: Secondary | ICD-10-CM | POA: Diagnosis not present

## 2023-09-27 DIAGNOSIS — Z87891 Personal history of nicotine dependence: Secondary | ICD-10-CM | POA: Diagnosis not present

## 2023-09-27 DIAGNOSIS — W19XXXD Unspecified fall, subsequent encounter: Secondary | ICD-10-CM | POA: Diagnosis not present

## 2023-09-27 DIAGNOSIS — S7222XD Displaced subtrochanteric fracture of left femur, subsequent encounter for closed fracture with routine healing: Secondary | ICD-10-CM | POA: Diagnosis not present

## 2023-09-27 DIAGNOSIS — E785 Hyperlipidemia, unspecified: Secondary | ICD-10-CM | POA: Diagnosis not present

## 2023-09-28 DIAGNOSIS — F0390 Unspecified dementia without behavioral disturbance: Secondary | ICD-10-CM | POA: Diagnosis not present

## 2023-09-28 DIAGNOSIS — Z87891 Personal history of nicotine dependence: Secondary | ICD-10-CM | POA: Diagnosis not present

## 2023-09-28 DIAGNOSIS — K5090 Crohn's disease, unspecified, without complications: Secondary | ICD-10-CM | POA: Diagnosis not present

## 2023-09-28 DIAGNOSIS — S7222XD Displaced subtrochanteric fracture of left femur, subsequent encounter for closed fracture with routine healing: Secondary | ICD-10-CM | POA: Diagnosis not present

## 2023-09-28 DIAGNOSIS — W19XXXD Unspecified fall, subsequent encounter: Secondary | ICD-10-CM | POA: Diagnosis not present

## 2023-09-28 DIAGNOSIS — E785 Hyperlipidemia, unspecified: Secondary | ICD-10-CM | POA: Diagnosis not present

## 2023-09-29 DIAGNOSIS — Z87891 Personal history of nicotine dependence: Secondary | ICD-10-CM | POA: Diagnosis not present

## 2023-09-29 DIAGNOSIS — S7222XD Displaced subtrochanteric fracture of left femur, subsequent encounter for closed fracture with routine healing: Secondary | ICD-10-CM | POA: Diagnosis not present

## 2023-09-29 DIAGNOSIS — K5090 Crohn's disease, unspecified, without complications: Secondary | ICD-10-CM | POA: Diagnosis not present

## 2023-09-29 DIAGNOSIS — E785 Hyperlipidemia, unspecified: Secondary | ICD-10-CM | POA: Diagnosis not present

## 2023-09-29 DIAGNOSIS — W19XXXD Unspecified fall, subsequent encounter: Secondary | ICD-10-CM | POA: Diagnosis not present

## 2023-09-29 DIAGNOSIS — F0390 Unspecified dementia without behavioral disturbance: Secondary | ICD-10-CM | POA: Diagnosis not present

## 2023-09-30 DIAGNOSIS — Z87891 Personal history of nicotine dependence: Secondary | ICD-10-CM | POA: Diagnosis not present

## 2023-09-30 DIAGNOSIS — S7222XD Displaced subtrochanteric fracture of left femur, subsequent encounter for closed fracture with routine healing: Secondary | ICD-10-CM | POA: Diagnosis not present

## 2023-09-30 DIAGNOSIS — K5090 Crohn's disease, unspecified, without complications: Secondary | ICD-10-CM | POA: Diagnosis not present

## 2023-09-30 DIAGNOSIS — W19XXXD Unspecified fall, subsequent encounter: Secondary | ICD-10-CM | POA: Diagnosis not present

## 2023-09-30 DIAGNOSIS — E785 Hyperlipidemia, unspecified: Secondary | ICD-10-CM | POA: Diagnosis not present

## 2023-09-30 DIAGNOSIS — F0390 Unspecified dementia without behavioral disturbance: Secondary | ICD-10-CM | POA: Diagnosis not present

## 2023-10-05 DIAGNOSIS — Z87891 Personal history of nicotine dependence: Secondary | ICD-10-CM | POA: Diagnosis not present

## 2023-10-05 DIAGNOSIS — S7222XD Displaced subtrochanteric fracture of left femur, subsequent encounter for closed fracture with routine healing: Secondary | ICD-10-CM | POA: Diagnosis not present

## 2023-10-05 DIAGNOSIS — E785 Hyperlipidemia, unspecified: Secondary | ICD-10-CM | POA: Diagnosis not present

## 2023-10-05 DIAGNOSIS — F0390 Unspecified dementia without behavioral disturbance: Secondary | ICD-10-CM | POA: Diagnosis not present

## 2023-10-05 DIAGNOSIS — W19XXXD Unspecified fall, subsequent encounter: Secondary | ICD-10-CM | POA: Diagnosis not present

## 2023-10-05 DIAGNOSIS — K5090 Crohn's disease, unspecified, without complications: Secondary | ICD-10-CM | POA: Diagnosis not present

## 2023-10-06 DIAGNOSIS — F0390 Unspecified dementia without behavioral disturbance: Secondary | ICD-10-CM | POA: Diagnosis not present

## 2023-10-06 DIAGNOSIS — K5090 Crohn's disease, unspecified, without complications: Secondary | ICD-10-CM | POA: Diagnosis not present

## 2023-10-06 DIAGNOSIS — Z87891 Personal history of nicotine dependence: Secondary | ICD-10-CM | POA: Diagnosis not present

## 2023-10-06 DIAGNOSIS — E785 Hyperlipidemia, unspecified: Secondary | ICD-10-CM | POA: Diagnosis not present

## 2023-10-06 DIAGNOSIS — S7222XD Displaced subtrochanteric fracture of left femur, subsequent encounter for closed fracture with routine healing: Secondary | ICD-10-CM | POA: Diagnosis not present

## 2023-10-06 DIAGNOSIS — W19XXXD Unspecified fall, subsequent encounter: Secondary | ICD-10-CM | POA: Diagnosis not present

## 2023-10-07 DIAGNOSIS — W19XXXD Unspecified fall, subsequent encounter: Secondary | ICD-10-CM | POA: Diagnosis not present

## 2023-10-07 DIAGNOSIS — K5090 Crohn's disease, unspecified, without complications: Secondary | ICD-10-CM | POA: Diagnosis not present

## 2023-10-07 DIAGNOSIS — F0390 Unspecified dementia without behavioral disturbance: Secondary | ICD-10-CM | POA: Diagnosis not present

## 2023-10-07 DIAGNOSIS — Z87891 Personal history of nicotine dependence: Secondary | ICD-10-CM | POA: Diagnosis not present

## 2023-10-07 DIAGNOSIS — E785 Hyperlipidemia, unspecified: Secondary | ICD-10-CM | POA: Diagnosis not present

## 2023-10-07 DIAGNOSIS — S7222XD Displaced subtrochanteric fracture of left femur, subsequent encounter for closed fracture with routine healing: Secondary | ICD-10-CM | POA: Diagnosis not present

## 2023-10-08 DIAGNOSIS — W19XXXD Unspecified fall, subsequent encounter: Secondary | ICD-10-CM | POA: Diagnosis not present

## 2023-10-08 DIAGNOSIS — Z87891 Personal history of nicotine dependence: Secondary | ICD-10-CM | POA: Diagnosis not present

## 2023-10-08 DIAGNOSIS — K5090 Crohn's disease, unspecified, without complications: Secondary | ICD-10-CM | POA: Diagnosis not present

## 2023-10-08 DIAGNOSIS — S7222XD Displaced subtrochanteric fracture of left femur, subsequent encounter for closed fracture with routine healing: Secondary | ICD-10-CM | POA: Diagnosis not present

## 2023-10-08 DIAGNOSIS — F0390 Unspecified dementia without behavioral disturbance: Secondary | ICD-10-CM | POA: Diagnosis not present

## 2023-10-08 DIAGNOSIS — E785 Hyperlipidemia, unspecified: Secondary | ICD-10-CM | POA: Diagnosis not present

## 2023-10-12 DIAGNOSIS — E785 Hyperlipidemia, unspecified: Secondary | ICD-10-CM | POA: Diagnosis not present

## 2023-10-12 DIAGNOSIS — K5090 Crohn's disease, unspecified, without complications: Secondary | ICD-10-CM | POA: Diagnosis not present

## 2023-10-12 DIAGNOSIS — Z87891 Personal history of nicotine dependence: Secondary | ICD-10-CM | POA: Diagnosis not present

## 2023-10-12 DIAGNOSIS — W19XXXD Unspecified fall, subsequent encounter: Secondary | ICD-10-CM | POA: Diagnosis not present

## 2023-10-12 DIAGNOSIS — F0390 Unspecified dementia without behavioral disturbance: Secondary | ICD-10-CM | POA: Diagnosis not present

## 2023-10-12 DIAGNOSIS — S7222XD Displaced subtrochanteric fracture of left femur, subsequent encounter for closed fracture with routine healing: Secondary | ICD-10-CM | POA: Diagnosis not present

## 2023-10-13 DIAGNOSIS — F0390 Unspecified dementia without behavioral disturbance: Secondary | ICD-10-CM | POA: Diagnosis not present

## 2023-10-13 DIAGNOSIS — K5090 Crohn's disease, unspecified, without complications: Secondary | ICD-10-CM | POA: Diagnosis not present

## 2023-10-13 DIAGNOSIS — W19XXXD Unspecified fall, subsequent encounter: Secondary | ICD-10-CM | POA: Diagnosis not present

## 2023-10-13 DIAGNOSIS — Z87891 Personal history of nicotine dependence: Secondary | ICD-10-CM | POA: Diagnosis not present

## 2023-10-13 DIAGNOSIS — E785 Hyperlipidemia, unspecified: Secondary | ICD-10-CM | POA: Diagnosis not present

## 2023-10-13 DIAGNOSIS — S7222XD Displaced subtrochanteric fracture of left femur, subsequent encounter for closed fracture with routine healing: Secondary | ICD-10-CM | POA: Diagnosis not present

## 2023-10-14 DIAGNOSIS — K5090 Crohn's disease, unspecified, without complications: Secondary | ICD-10-CM | POA: Diagnosis not present

## 2023-10-14 DIAGNOSIS — E785 Hyperlipidemia, unspecified: Secondary | ICD-10-CM | POA: Diagnosis not present

## 2023-10-14 DIAGNOSIS — S7222XD Displaced subtrochanteric fracture of left femur, subsequent encounter for closed fracture with routine healing: Secondary | ICD-10-CM | POA: Diagnosis not present

## 2023-10-14 DIAGNOSIS — W19XXXD Unspecified fall, subsequent encounter: Secondary | ICD-10-CM | POA: Diagnosis not present

## 2023-10-14 DIAGNOSIS — Z87891 Personal history of nicotine dependence: Secondary | ICD-10-CM | POA: Diagnosis not present

## 2023-10-14 DIAGNOSIS — F0390 Unspecified dementia without behavioral disturbance: Secondary | ICD-10-CM | POA: Diagnosis not present

## 2023-10-19 DIAGNOSIS — F0390 Unspecified dementia without behavioral disturbance: Secondary | ICD-10-CM | POA: Diagnosis not present

## 2023-10-19 DIAGNOSIS — W19XXXD Unspecified fall, subsequent encounter: Secondary | ICD-10-CM | POA: Diagnosis not present

## 2023-10-19 DIAGNOSIS — S7222XD Displaced subtrochanteric fracture of left femur, subsequent encounter for closed fracture with routine healing: Secondary | ICD-10-CM | POA: Diagnosis not present

## 2023-10-19 DIAGNOSIS — Z87891 Personal history of nicotine dependence: Secondary | ICD-10-CM | POA: Diagnosis not present

## 2023-10-19 DIAGNOSIS — E785 Hyperlipidemia, unspecified: Secondary | ICD-10-CM | POA: Diagnosis not present

## 2023-10-19 DIAGNOSIS — K5090 Crohn's disease, unspecified, without complications: Secondary | ICD-10-CM | POA: Diagnosis not present

## 2023-10-20 DIAGNOSIS — Z8781 Personal history of (healed) traumatic fracture: Secondary | ICD-10-CM | POA: Diagnosis not present

## 2023-10-20 DIAGNOSIS — M25562 Pain in left knee: Secondary | ICD-10-CM | POA: Diagnosis not present

## 2023-10-20 DIAGNOSIS — M85852 Other specified disorders of bone density and structure, left thigh: Secondary | ICD-10-CM | POA: Diagnosis not present

## 2023-10-20 DIAGNOSIS — Z9889 Other specified postprocedural states: Secondary | ICD-10-CM | POA: Diagnosis not present

## 2023-10-21 DIAGNOSIS — Z9181 History of falling: Secondary | ICD-10-CM | POA: Diagnosis not present

## 2023-10-21 DIAGNOSIS — K5090 Crohn's disease, unspecified, without complications: Secondary | ICD-10-CM | POA: Diagnosis not present

## 2023-10-21 DIAGNOSIS — E785 Hyperlipidemia, unspecified: Secondary | ICD-10-CM | POA: Diagnosis not present

## 2023-10-21 DIAGNOSIS — S7222XD Displaced subtrochanteric fracture of left femur, subsequent encounter for closed fracture with routine healing: Secondary | ICD-10-CM | POA: Diagnosis not present

## 2023-10-21 DIAGNOSIS — Z87891 Personal history of nicotine dependence: Secondary | ICD-10-CM | POA: Diagnosis not present

## 2023-10-21 DIAGNOSIS — W19XXXD Unspecified fall, subsequent encounter: Secondary | ICD-10-CM | POA: Diagnosis not present

## 2023-10-21 DIAGNOSIS — F0390 Unspecified dementia without behavioral disturbance: Secondary | ICD-10-CM | POA: Diagnosis not present

## 2023-10-25 DIAGNOSIS — E785 Hyperlipidemia, unspecified: Secondary | ICD-10-CM | POA: Diagnosis not present

## 2023-10-25 DIAGNOSIS — W19XXXD Unspecified fall, subsequent encounter: Secondary | ICD-10-CM | POA: Diagnosis not present

## 2023-10-25 DIAGNOSIS — S7222XD Displaced subtrochanteric fracture of left femur, subsequent encounter for closed fracture with routine healing: Secondary | ICD-10-CM | POA: Diagnosis not present

## 2023-10-25 DIAGNOSIS — Z87891 Personal history of nicotine dependence: Secondary | ICD-10-CM | POA: Diagnosis not present

## 2023-10-25 DIAGNOSIS — K5090 Crohn's disease, unspecified, without complications: Secondary | ICD-10-CM | POA: Diagnosis not present

## 2023-10-25 DIAGNOSIS — F0390 Unspecified dementia without behavioral disturbance: Secondary | ICD-10-CM | POA: Diagnosis not present

## 2023-10-26 DIAGNOSIS — Z87891 Personal history of nicotine dependence: Secondary | ICD-10-CM | POA: Diagnosis not present

## 2023-10-26 DIAGNOSIS — W19XXXD Unspecified fall, subsequent encounter: Secondary | ICD-10-CM | POA: Diagnosis not present

## 2023-10-26 DIAGNOSIS — E785 Hyperlipidemia, unspecified: Secondary | ICD-10-CM | POA: Diagnosis not present

## 2023-10-26 DIAGNOSIS — S7222XD Displaced subtrochanteric fracture of left femur, subsequent encounter for closed fracture with routine healing: Secondary | ICD-10-CM | POA: Diagnosis not present

## 2023-10-26 DIAGNOSIS — K5090 Crohn's disease, unspecified, without complications: Secondary | ICD-10-CM | POA: Diagnosis not present

## 2023-10-26 DIAGNOSIS — F0390 Unspecified dementia without behavioral disturbance: Secondary | ICD-10-CM | POA: Diagnosis not present

## 2023-10-26 DIAGNOSIS — M810 Age-related osteoporosis without current pathological fracture: Secondary | ICD-10-CM | POA: Diagnosis not present

## 2023-11-01 DIAGNOSIS — S7222XD Displaced subtrochanteric fracture of left femur, subsequent encounter for closed fracture with routine healing: Secondary | ICD-10-CM | POA: Diagnosis not present

## 2023-11-01 DIAGNOSIS — Z87891 Personal history of nicotine dependence: Secondary | ICD-10-CM | POA: Diagnosis not present

## 2023-11-01 DIAGNOSIS — E785 Hyperlipidemia, unspecified: Secondary | ICD-10-CM | POA: Diagnosis not present

## 2023-11-01 DIAGNOSIS — F0390 Unspecified dementia without behavioral disturbance: Secondary | ICD-10-CM | POA: Diagnosis not present

## 2023-11-01 DIAGNOSIS — W19XXXD Unspecified fall, subsequent encounter: Secondary | ICD-10-CM | POA: Diagnosis not present

## 2023-11-01 DIAGNOSIS — K5090 Crohn's disease, unspecified, without complications: Secondary | ICD-10-CM | POA: Diagnosis not present

## 2023-11-03 DIAGNOSIS — E785 Hyperlipidemia, unspecified: Secondary | ICD-10-CM | POA: Diagnosis not present

## 2023-11-03 DIAGNOSIS — I10 Essential (primary) hypertension: Secondary | ICD-10-CM | POA: Diagnosis not present

## 2023-11-03 DIAGNOSIS — Z87891 Personal history of nicotine dependence: Secondary | ICD-10-CM | POA: Diagnosis not present

## 2023-11-03 DIAGNOSIS — G319 Degenerative disease of nervous system, unspecified: Secondary | ICD-10-CM | POA: Diagnosis not present

## 2023-11-03 DIAGNOSIS — Z79899 Other long term (current) drug therapy: Secondary | ICD-10-CM | POA: Diagnosis not present

## 2023-11-03 DIAGNOSIS — E7800 Pure hypercholesterolemia, unspecified: Secondary | ICD-10-CM | POA: Diagnosis not present

## 2023-11-03 DIAGNOSIS — S7222XD Displaced subtrochanteric fracture of left femur, subsequent encounter for closed fracture with routine healing: Secondary | ICD-10-CM | POA: Diagnosis not present

## 2023-11-03 DIAGNOSIS — G252 Other specified forms of tremor: Secondary | ICD-10-CM | POA: Diagnosis not present

## 2023-11-03 DIAGNOSIS — M810 Age-related osteoporosis without current pathological fracture: Secondary | ICD-10-CM | POA: Diagnosis not present

## 2023-11-03 DIAGNOSIS — D649 Anemia, unspecified: Secondary | ICD-10-CM | POA: Diagnosis not present

## 2023-11-03 DIAGNOSIS — K219 Gastro-esophageal reflux disease without esophagitis: Secondary | ICD-10-CM | POA: Diagnosis not present

## 2023-11-03 DIAGNOSIS — F0390 Unspecified dementia without behavioral disturbance: Secondary | ICD-10-CM | POA: Diagnosis not present

## 2023-11-03 DIAGNOSIS — J3089 Other allergic rhinitis: Secondary | ICD-10-CM | POA: Diagnosis not present

## 2023-11-03 DIAGNOSIS — K5090 Crohn's disease, unspecified, without complications: Secondary | ICD-10-CM | POA: Diagnosis not present

## 2023-11-03 DIAGNOSIS — W19XXXD Unspecified fall, subsequent encounter: Secondary | ICD-10-CM | POA: Diagnosis not present

## 2023-11-12 DIAGNOSIS — M25562 Pain in left knee: Secondary | ICD-10-CM | POA: Diagnosis not present

## 2023-11-12 DIAGNOSIS — Z9889 Other specified postprocedural states: Secondary | ICD-10-CM | POA: Diagnosis not present

## 2023-11-12 DIAGNOSIS — Z8781 Personal history of (healed) traumatic fracture: Secondary | ICD-10-CM | POA: Diagnosis not present

## 2023-11-17 DIAGNOSIS — Z9889 Other specified postprocedural states: Secondary | ICD-10-CM | POA: Diagnosis not present

## 2023-11-17 DIAGNOSIS — Z8781 Personal history of (healed) traumatic fracture: Secondary | ICD-10-CM | POA: Diagnosis not present

## 2023-11-17 DIAGNOSIS — M25562 Pain in left knee: Secondary | ICD-10-CM | POA: Diagnosis not present

## 2023-11-26 DIAGNOSIS — Z8781 Personal history of (healed) traumatic fracture: Secondary | ICD-10-CM | POA: Diagnosis not present

## 2023-11-26 DIAGNOSIS — Z9889 Other specified postprocedural states: Secondary | ICD-10-CM | POA: Diagnosis not present

## 2023-11-26 DIAGNOSIS — M25562 Pain in left knee: Secondary | ICD-10-CM | POA: Diagnosis not present

## 2023-12-03 DIAGNOSIS — Z8781 Personal history of (healed) traumatic fracture: Secondary | ICD-10-CM | POA: Diagnosis not present

## 2023-12-03 DIAGNOSIS — Z9889 Other specified postprocedural states: Secondary | ICD-10-CM | POA: Diagnosis not present

## 2023-12-03 DIAGNOSIS — M25562 Pain in left knee: Secondary | ICD-10-CM | POA: Diagnosis not present

## 2023-12-06 DIAGNOSIS — Z9889 Other specified postprocedural states: Secondary | ICD-10-CM | POA: Diagnosis not present

## 2023-12-06 DIAGNOSIS — M25562 Pain in left knee: Secondary | ICD-10-CM | POA: Diagnosis not present

## 2023-12-06 DIAGNOSIS — Z8781 Personal history of (healed) traumatic fracture: Secondary | ICD-10-CM | POA: Diagnosis not present

## 2023-12-09 DIAGNOSIS — Z9889 Other specified postprocedural states: Secondary | ICD-10-CM | POA: Diagnosis not present

## 2023-12-09 DIAGNOSIS — M25562 Pain in left knee: Secondary | ICD-10-CM | POA: Diagnosis not present

## 2023-12-09 DIAGNOSIS — Z1231 Encounter for screening mammogram for malignant neoplasm of breast: Secondary | ICD-10-CM | POA: Diagnosis not present

## 2023-12-09 DIAGNOSIS — Z8781 Personal history of (healed) traumatic fracture: Secondary | ICD-10-CM | POA: Diagnosis not present

## 2023-12-13 DIAGNOSIS — Z8781 Personal history of (healed) traumatic fracture: Secondary | ICD-10-CM | POA: Diagnosis not present

## 2023-12-13 DIAGNOSIS — Z9889 Other specified postprocedural states: Secondary | ICD-10-CM | POA: Diagnosis not present

## 2023-12-13 DIAGNOSIS — M25562 Pain in left knee: Secondary | ICD-10-CM | POA: Diagnosis not present

## 2023-12-16 DIAGNOSIS — Z961 Presence of intraocular lens: Secondary | ICD-10-CM | POA: Diagnosis not present

## 2023-12-16 DIAGNOSIS — H18519 Endothelial corneal dystrophy, unspecified eye: Secondary | ICD-10-CM | POA: Diagnosis not present

## 2023-12-17 DIAGNOSIS — Z8781 Personal history of (healed) traumatic fracture: Secondary | ICD-10-CM | POA: Diagnosis not present

## 2023-12-17 DIAGNOSIS — M25562 Pain in left knee: Secondary | ICD-10-CM | POA: Diagnosis not present

## 2023-12-17 DIAGNOSIS — Z9889 Other specified postprocedural states: Secondary | ICD-10-CM | POA: Diagnosis not present

## 2023-12-22 DIAGNOSIS — Z8781 Personal history of (healed) traumatic fracture: Secondary | ICD-10-CM | POA: Diagnosis not present

## 2023-12-22 DIAGNOSIS — Z9889 Other specified postprocedural states: Secondary | ICD-10-CM | POA: Diagnosis not present

## 2023-12-22 DIAGNOSIS — M25562 Pain in left knee: Secondary | ICD-10-CM | POA: Diagnosis not present

## 2023-12-24 DIAGNOSIS — Z9889 Other specified postprocedural states: Secondary | ICD-10-CM | POA: Diagnosis not present

## 2023-12-24 DIAGNOSIS — M25562 Pain in left knee: Secondary | ICD-10-CM | POA: Diagnosis not present

## 2023-12-24 DIAGNOSIS — Z8781 Personal history of (healed) traumatic fracture: Secondary | ICD-10-CM | POA: Diagnosis not present

## 2023-12-28 DIAGNOSIS — Z9889 Other specified postprocedural states: Secondary | ICD-10-CM | POA: Diagnosis not present

## 2023-12-28 DIAGNOSIS — Z8781 Personal history of (healed) traumatic fracture: Secondary | ICD-10-CM | POA: Diagnosis not present

## 2023-12-28 DIAGNOSIS — M25562 Pain in left knee: Secondary | ICD-10-CM | POA: Diagnosis not present

## 2024-01-05 DIAGNOSIS — Z8781 Personal history of (healed) traumatic fracture: Secondary | ICD-10-CM | POA: Diagnosis not present

## 2024-01-05 DIAGNOSIS — Z9889 Other specified postprocedural states: Secondary | ICD-10-CM | POA: Diagnosis not present

## 2024-01-05 DIAGNOSIS — M25562 Pain in left knee: Secondary | ICD-10-CM | POA: Diagnosis not present

## 2024-01-07 DIAGNOSIS — Z8781 Personal history of (healed) traumatic fracture: Secondary | ICD-10-CM | POA: Diagnosis not present

## 2024-01-07 DIAGNOSIS — Z9889 Other specified postprocedural states: Secondary | ICD-10-CM | POA: Diagnosis not present

## 2024-01-07 DIAGNOSIS — M25562 Pain in left knee: Secondary | ICD-10-CM | POA: Diagnosis not present

## 2024-01-12 DIAGNOSIS — M25562 Pain in left knee: Secondary | ICD-10-CM | POA: Diagnosis not present

## 2024-01-12 DIAGNOSIS — Z9889 Other specified postprocedural states: Secondary | ICD-10-CM | POA: Diagnosis not present

## 2024-01-12 DIAGNOSIS — Z8781 Personal history of (healed) traumatic fracture: Secondary | ICD-10-CM | POA: Diagnosis not present

## 2024-01-14 DIAGNOSIS — M25562 Pain in left knee: Secondary | ICD-10-CM | POA: Diagnosis not present

## 2024-01-14 DIAGNOSIS — Z8781 Personal history of (healed) traumatic fracture: Secondary | ICD-10-CM | POA: Diagnosis not present

## 2024-01-14 DIAGNOSIS — Z9889 Other specified postprocedural states: Secondary | ICD-10-CM | POA: Diagnosis not present

## 2024-01-21 DIAGNOSIS — M25562 Pain in left knee: Secondary | ICD-10-CM | POA: Diagnosis not present

## 2024-01-21 DIAGNOSIS — Z9889 Other specified postprocedural states: Secondary | ICD-10-CM | POA: Diagnosis not present

## 2024-01-21 DIAGNOSIS — Z8781 Personal history of (healed) traumatic fracture: Secondary | ICD-10-CM | POA: Diagnosis not present

## 2024-01-24 DIAGNOSIS — R251 Tremor, unspecified: Secondary | ICD-10-CM | POA: Diagnosis not present

## 2024-01-24 DIAGNOSIS — R413 Other amnesia: Secondary | ICD-10-CM | POA: Diagnosis not present

## 2024-01-24 DIAGNOSIS — F0390 Unspecified dementia without behavioral disturbance: Secondary | ICD-10-CM | POA: Diagnosis not present

## 2024-01-28 DIAGNOSIS — Z8781 Personal history of (healed) traumatic fracture: Secondary | ICD-10-CM | POA: Diagnosis not present

## 2024-01-28 DIAGNOSIS — Z9889 Other specified postprocedural states: Secondary | ICD-10-CM | POA: Diagnosis not present

## 2024-01-28 DIAGNOSIS — M25562 Pain in left knee: Secondary | ICD-10-CM | POA: Diagnosis not present

## 2024-02-04 DIAGNOSIS — M25562 Pain in left knee: Secondary | ICD-10-CM | POA: Diagnosis not present

## 2024-02-04 DIAGNOSIS — Z9889 Other specified postprocedural states: Secondary | ICD-10-CM | POA: Diagnosis not present

## 2024-02-04 DIAGNOSIS — Z8781 Personal history of (healed) traumatic fracture: Secondary | ICD-10-CM | POA: Diagnosis not present

## 2024-02-23 DIAGNOSIS — Z9889 Other specified postprocedural states: Secondary | ICD-10-CM | POA: Diagnosis not present

## 2024-02-23 DIAGNOSIS — M25562 Pain in left knee: Secondary | ICD-10-CM | POA: Diagnosis not present

## 2024-02-23 DIAGNOSIS — Z8781 Personal history of (healed) traumatic fracture: Secondary | ICD-10-CM | POA: Diagnosis not present

## 2024-05-03 DIAGNOSIS — G252 Other specified forms of tremor: Secondary | ICD-10-CM | POA: Diagnosis not present

## 2024-05-03 DIAGNOSIS — D649 Anemia, unspecified: Secondary | ICD-10-CM | POA: Diagnosis not present

## 2024-05-03 DIAGNOSIS — Z79899 Other long term (current) drug therapy: Secondary | ICD-10-CM | POA: Diagnosis not present

## 2024-05-03 DIAGNOSIS — F0390 Unspecified dementia without behavioral disturbance: Secondary | ICD-10-CM | POA: Diagnosis not present

## 2024-05-03 DIAGNOSIS — K219 Gastro-esophageal reflux disease without esophagitis: Secondary | ICD-10-CM | POA: Diagnosis not present

## 2024-05-03 DIAGNOSIS — K5090 Crohn's disease, unspecified, without complications: Secondary | ICD-10-CM | POA: Diagnosis not present

## 2024-05-03 DIAGNOSIS — E7800 Pure hypercholesterolemia, unspecified: Secondary | ICD-10-CM | POA: Diagnosis not present

## 2024-05-03 DIAGNOSIS — N3944 Nocturnal enuresis: Secondary | ICD-10-CM | POA: Diagnosis not present

## 2024-05-03 DIAGNOSIS — M810 Age-related osteoporosis without current pathological fracture: Secondary | ICD-10-CM | POA: Diagnosis not present

## 2024-05-03 DIAGNOSIS — Z23 Encounter for immunization: Secondary | ICD-10-CM | POA: Diagnosis not present

## 2024-05-03 DIAGNOSIS — I10 Essential (primary) hypertension: Secondary | ICD-10-CM | POA: Diagnosis not present

## 2024-05-03 DIAGNOSIS — J3089 Other allergic rhinitis: Secondary | ICD-10-CM | POA: Diagnosis not present

## 2024-07-25 DIAGNOSIS — R413 Other amnesia: Secondary | ICD-10-CM | POA: Diagnosis not present

## 2024-07-25 DIAGNOSIS — Z1331 Encounter for screening for depression: Secondary | ICD-10-CM | POA: Diagnosis not present

## 2024-09-06 DIAGNOSIS — M6281 Muscle weakness (generalized): Secondary | ICD-10-CM | POA: Diagnosis not present

## 2024-09-10 DIAGNOSIS — Z23 Encounter for immunization: Secondary | ICD-10-CM | POA: Diagnosis not present

## 2024-09-11 DIAGNOSIS — M6281 Muscle weakness (generalized): Secondary | ICD-10-CM | POA: Diagnosis not present

## 2024-09-15 DIAGNOSIS — M6281 Muscle weakness (generalized): Secondary | ICD-10-CM | POA: Diagnosis not present

## 2024-09-19 DIAGNOSIS — M6281 Muscle weakness (generalized): Secondary | ICD-10-CM | POA: Diagnosis not present

## 2024-09-21 DIAGNOSIS — M6281 Muscle weakness (generalized): Secondary | ICD-10-CM | POA: Diagnosis not present

## 2024-09-26 DIAGNOSIS — M6281 Muscle weakness (generalized): Secondary | ICD-10-CM | POA: Diagnosis not present

## 2024-09-28 DIAGNOSIS — M6281 Muscle weakness (generalized): Secondary | ICD-10-CM | POA: Diagnosis not present

## 2024-10-03 DIAGNOSIS — M6281 Muscle weakness (generalized): Secondary | ICD-10-CM | POA: Diagnosis not present

## 2024-10-06 DIAGNOSIS — M6281 Muscle weakness (generalized): Secondary | ICD-10-CM | POA: Diagnosis not present

## 2024-10-16 DIAGNOSIS — M6281 Muscle weakness (generalized): Secondary | ICD-10-CM | POA: Diagnosis not present

## 2024-10-19 DIAGNOSIS — M6281 Muscle weakness (generalized): Secondary | ICD-10-CM | POA: Diagnosis not present

## 2024-10-23 DIAGNOSIS — M6281 Muscle weakness (generalized): Secondary | ICD-10-CM | POA: Diagnosis not present

## 2024-10-25 DIAGNOSIS — M6281 Muscle weakness (generalized): Secondary | ICD-10-CM | POA: Diagnosis not present

## 2024-11-01 DIAGNOSIS — M6281 Muscle weakness (generalized): Secondary | ICD-10-CM | POA: Diagnosis not present

## 2024-11-03 DIAGNOSIS — M6281 Muscle weakness (generalized): Secondary | ICD-10-CM | POA: Diagnosis not present

## 2024-11-09 DIAGNOSIS — G252 Other specified forms of tremor: Secondary | ICD-10-CM | POA: Diagnosis not present

## 2024-11-09 DIAGNOSIS — F0390 Unspecified dementia without behavioral disturbance: Secondary | ICD-10-CM | POA: Diagnosis not present

## 2024-11-09 DIAGNOSIS — I10 Essential (primary) hypertension: Secondary | ICD-10-CM | POA: Diagnosis not present

## 2024-11-09 DIAGNOSIS — N3944 Nocturnal enuresis: Secondary | ICD-10-CM | POA: Diagnosis not present

## 2024-11-09 DIAGNOSIS — Z9181 History of falling: Secondary | ICD-10-CM | POA: Diagnosis not present

## 2024-11-09 DIAGNOSIS — Z1331 Encounter for screening for depression: Secondary | ICD-10-CM | POA: Diagnosis not present

## 2024-11-09 DIAGNOSIS — E7800 Pure hypercholesterolemia, unspecified: Secondary | ICD-10-CM | POA: Diagnosis not present

## 2024-11-09 DIAGNOSIS — J3089 Other allergic rhinitis: Secondary | ICD-10-CM | POA: Diagnosis not present

## 2024-11-09 DIAGNOSIS — K219 Gastro-esophageal reflux disease without esophagitis: Secondary | ICD-10-CM | POA: Diagnosis not present

## 2024-11-09 DIAGNOSIS — D649 Anemia, unspecified: Secondary | ICD-10-CM | POA: Diagnosis not present

## 2024-11-09 DIAGNOSIS — K5090 Crohn's disease, unspecified, without complications: Secondary | ICD-10-CM | POA: Diagnosis not present

## 2024-11-09 DIAGNOSIS — M810 Age-related osteoporosis without current pathological fracture: Secondary | ICD-10-CM | POA: Diagnosis not present

## 2024-11-09 DIAGNOSIS — Z79899 Other long term (current) drug therapy: Secondary | ICD-10-CM | POA: Diagnosis not present

## 2024-11-10 DIAGNOSIS — M6281 Muscle weakness (generalized): Secondary | ICD-10-CM | POA: Diagnosis not present

## 2024-11-13 DIAGNOSIS — M6281 Muscle weakness (generalized): Secondary | ICD-10-CM | POA: Diagnosis not present
